# Patient Record
Sex: Female | Born: 1995 | Race: White | Hispanic: No | Marital: Single | Smoking: Current some day smoker
Health system: Southern US, Community
[De-identification: ages and names within clinical notes are randomized; demographics above are authoritative.]

## PROBLEM LIST (undated history)

## (undated) DIAGNOSIS — E109 Type 1 diabetes mellitus without complications: Secondary | ICD-10-CM

## (undated) HISTORY — PX: BACK SURGERY: SHX140

---
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
## 2015-01-01 ENCOUNTER — Encounter: Payer: Self-pay | Admitting: Internal Medicine

## 2015-01-01 DIAGNOSIS — F17210 Nicotine dependence, cigarettes, uncomplicated: Secondary | ICD-10-CM | POA: Diagnosis present

## 2015-01-01 DIAGNOSIS — Z833 Family history of diabetes mellitus: Secondary | ICD-10-CM | POA: Diagnosis not present

## 2015-01-01 DIAGNOSIS — E1110 Type 2 diabetes mellitus with ketoacidosis without coma: Secondary | ICD-10-CM | POA: Diagnosis present

## 2015-01-01 DIAGNOSIS — E1010 Type 1 diabetes mellitus with ketoacidosis without coma: Secondary | ICD-10-CM | POA: Diagnosis present

## 2015-01-01 DIAGNOSIS — Z794 Long term (current) use of insulin: Secondary | ICD-10-CM | POA: Diagnosis not present

## 2015-01-01 DIAGNOSIS — Z9889 Other specified postprocedural states: Secondary | ICD-10-CM | POA: Diagnosis not present

## 2015-01-01 LAB — COMPREHENSIVE METABOLIC PANEL
ALT: 14 U/L (ref 14–54)
AST: 15 U/L (ref 15–41)
Albumin: 4.3 g/dL (ref 3.5–5.0)
Alkaline Phosphatase: 74 U/L (ref 38–126)
Anion gap: 20 — ABNORMAL HIGH (ref 5–15)
BUN: 8 mg/dL (ref 6–20)
CHLORIDE: 103 mmol/L (ref 101–111)
CO2: 10 mmol/L — ABNORMAL LOW (ref 22–32)
CREATININE: 1.04 mg/dL — AB (ref 0.44–1.00)
Calcium: 9 mg/dL (ref 8.9–10.3)
GFR calc non Af Amer: >60 mL/min (ref >60)
Glucose, Bld: 372 mg/dL — ABNORMAL HIGH (ref 65–99)
POTASSIUM: 4.4 mmol/L (ref 3.5–5.1)
Sodium: 133 mmol/L — ABNORMAL LOW (ref 135–145)
Total Bilirubin: 1.2 mg/dL (ref 0.3–1.2)
Total Protein: 8.7 g/dL — ABNORMAL HIGH (ref 6.5–8.1)

## 2015-01-01 LAB — BASIC METABOLIC PANEL
Anion gap: 16 — ABNORMAL HIGH (ref 5–15)
Anion gap: 7 (ref 5–15)
Anion gap: 7 (ref 5–15)
BUN: 6 mg/dL (ref 6–20)
BUN: 6 mg/dL (ref 6–20)
BUN: 5 mg/dL — ABNORMAL LOW (ref 6–20)
CO2: 9 mmol/L — ABNORMAL LOW (ref 22–32)
CO2: 15 mmol/L — ABNORMAL LOW (ref 22–32)
CO2: 18 mmol/L — ABNORMAL LOW (ref 22–32)
Calcium: 8 mg/dL — ABNORMAL LOW (ref 8.9–10.3)
Calcium: 8.3 mg/dL — ABNORMAL LOW (ref 8.9–10.3)
Calcium: 8.4 mg/dL — ABNORMAL LOW (ref 8.9–10.3)
Chloride: 111 mmol/L (ref 101–111)
Chloride: 114 mmol/L — ABNORMAL HIGH (ref 101–111)
Chloride: 111 mmol/L (ref 101–111)
Creatinine, Ser: 0.92 mg/dL (ref 0.44–1.00)
Creatinine, Ser: 0.64 mg/dL (ref 0.44–1.00)
Creatinine, Ser: 0.82 mg/dL (ref 0.44–1.00)
GFR calc Af Amer: >60 mL/min (ref >60)
GFR calc Af Amer: >60 mL/min (ref >60)
GFR calc Af Amer: >60 mL/min (ref >60)
GFR calc non Af Amer: >60 mL/min (ref >60)
GFR calc non Af Amer: >60 mL/min (ref >60)
GFR calc non Af Amer: >60 mL/min (ref >60)
Glucose, Bld: 239 mg/dL — ABNORMAL HIGH (ref 65–99)
Glucose, Bld: 117 mg/dL — ABNORMAL HIGH (ref 65–99)
Glucose, Bld: 138 mg/dL — ABNORMAL HIGH (ref 65–99)
Potassium: 3.9 mmol/L (ref 3.5–5.1)
Potassium: 3.8 mmol/L (ref 3.5–5.1)
Potassium: 4.1 mmol/L (ref 3.5–5.1)
Sodium: 136 mmol/L (ref 135–145)
Sodium: 136 mmol/L (ref 135–145)
Sodium: 136 mmol/L (ref 135–145)

## 2015-01-01 LAB — GLUCOSE, CAPILLARY
Glucose-Capillary: 302 mg/dL — ABNORMAL HIGH (ref 65–99)
Glucose-Capillary: 322 mg/dL — ABNORMAL HIGH (ref 65–99)
Glucose-Capillary: 278 mg/dL — ABNORMAL HIGH (ref 65–99)
Glucose-Capillary: 209 mg/dL — ABNORMAL HIGH (ref 65–99)
Glucose-Capillary: 219 mg/dL — ABNORMAL HIGH (ref 65–99)
Glucose-Capillary: 210 mg/dL — ABNORMAL HIGH (ref 65–99)
Glucose-Capillary: 178 mg/dL — ABNORMAL HIGH (ref 65–99)
Glucose-Capillary: 159 mg/dL — ABNORMAL HIGH (ref 65–99)
Glucose-Capillary: 131 mg/dL — ABNORMAL HIGH (ref 65–99)
Glucose-Capillary: 107 mg/dL — ABNORMAL HIGH (ref 65–99)
Glucose-Capillary: 150 mg/dL — ABNORMAL HIGH (ref 65–99)
Glucose-Capillary: 129 mg/dL — ABNORMAL HIGH (ref 65–99)
Glucose-Capillary: 122 mg/dL — ABNORMAL HIGH (ref 65–99)
Glucose-Capillary: 221 mg/dL — ABNORMAL HIGH (ref 65–99)
Glucose-Capillary: 242 mg/dL — ABNORMAL HIGH (ref 65–99)
Glucose-Capillary: 180 mg/dL — ABNORMAL HIGH (ref 65–99)
Glucose-Capillary: 105 mg/dL — ABNORMAL HIGH (ref 65–99)
Glucose-Capillary: 265 mg/dL — ABNORMAL HIGH (ref 65–99)

## 2015-01-01 LAB — TSH: TSH: 0.96 u[IU]/mL (ref 0.350–4.500)

## 2015-01-01 LAB — URINALYSIS COMPLETE WITH MICROSCOPIC (ARMC ONLY)
BACTERIA UA: NONE SEEN
BILIRUBIN URINE: NEGATIVE
Glucose, UA: >500 mg/dL — AB
Hgb urine dipstick: NEGATIVE
Leukocytes, UA: NEGATIVE
NITRITE: NEGATIVE
PH: 5 (ref 5.0–8.0)
Protein, ur: 100 mg/dL — AB
SPECIFIC GRAVITY, URINE: 1.028 (ref 1.005–1.030)

## 2015-01-01 LAB — CBC
HCT: 45.1 % (ref 35.0–47.0)
HEMOGLOBIN: 14.6 g/dL (ref 12.0–16.0)
MCH: 28.8 pg (ref 26.0–34.0)
MCHC: 32.4 g/dL (ref 32.0–36.0)
MCV: 88.9 fL (ref 80.0–100.0)
PLATELETS: 272 10*3/uL (ref 150–440)
RBC: 5.07 MIL/uL (ref 3.80–5.20)
RDW: 12.7 % (ref 11.5–14.5)
WBC: 8 10*3/uL (ref 3.6–11.0)

## 2015-01-01 LAB — POCT PREGNANCY, URINE: Preg Test, Ur: NEGATIVE

## 2015-01-01 LAB — MRSA PCR SCREENING: MRSA by PCR: NEGATIVE

## 2015-01-01 MED ORDER — SODIUM CHLORIDE 0.9 % IV SOLN
INTRAVENOUS | Status: DC
  Administered 2015-01-01: 2.6 [IU]/h via INTRAVENOUS
  Administered 2015-01-01: 4.4 [IU]/h via INTRAVENOUS
  Filled 2015-01-01: qty 2.5

## 2015-01-01 MED ORDER — POTASSIUM CHLORIDE 10 MEQ/100ML IV SOLN
10.0000 meq | INTRAVENOUS | Status: DC
  Administered 2015-01-01: 10 meq via INTRAVENOUS
  Filled 2015-01-01 (×2): qty 100

## 2015-01-01 MED ORDER — PROMETHAZINE HCL 25 MG/ML IJ SOLN: 12.5000 mg | Freq: Four times a day (QID) | INTRAMUSCULAR | Status: DC | PRN

## 2015-01-01 MED ORDER — ENOXAPARIN SODIUM 40 MG/0.4ML ~~LOC~~ SOLN
40.0000 mg | SUBCUTANEOUS | Status: DC
  Filled 2015-01-01: qty 0.4

## 2015-01-01 MED ORDER — INSULIN ASPART 100 UNIT/ML ~~LOC~~ SOLN
0.0000 [IU] | Freq: Three times a day (TID) | SUBCUTANEOUS | Status: DC
  Administered 2015-01-01: 5 [IU] via SUBCUTANEOUS
  Administered 2015-01-02: 2 [IU] via SUBCUTANEOUS
  Filled 2015-01-01: qty 2
  Filled 2015-01-01: qty 5

## 2015-01-01 MED ORDER — KCL IN DEXTROSE-NACL 20-5-0.45 MEQ/L-%-% IV SOLN
INTRAVENOUS | Status: DC
  Administered 2015-01-01: 05:00:00 via INTRAVENOUS
  Filled 2015-01-01 (×3): qty 1000

## 2015-01-01 MED ORDER — INSULIN ASPART 100 UNIT/ML ~~LOC~~ SOLN
0.0000 [IU] | Freq: Every day | SUBCUTANEOUS | Status: DC
  Administered 2015-01-01: 3 [IU] via SUBCUTANEOUS
  Filled 2015-01-01: qty 3

## 2015-01-01 MED ORDER — SODIUM CHLORIDE 0.9 % IV SOLN
INTRAVENOUS | Status: DC
  Administered 2015-01-01: 03:00:00 via INTRAVENOUS

## 2015-01-01 MED ORDER — DEXTROSE-NACL 5-0.45 % IV SOLN: INTRAVENOUS | Status: DC

## 2015-01-01 MED ORDER — POTASSIUM CHLORIDE 2 MEQ/ML IV SOLN: INTRAVENOUS | Status: DC

## 2015-01-01 MED ORDER — INSULIN GLARGINE 100 UNIT/ML ~~LOC~~ SOLN
25.0000 [IU] | SUBCUTANEOUS | Status: DC
  Administered 2015-01-01: 25 [IU] via SUBCUTANEOUS
  Filled 2015-01-01 (×3): qty 0.25

## 2015-01-01 MED ORDER — ONDANSETRON HCL 4 MG/2ML IJ SOLN
4.0000 mg | Freq: Four times a day (QID) | INTRAMUSCULAR | Status: DC | PRN
  Administered 2015-01-01: 4 mg via INTRAVENOUS
  Filled 2015-01-01: qty 2

## 2015-01-01 MED ORDER — SODIUM CHLORIDE 0.9 % IV BOLUS (SEPSIS)
1000.0000 mL | Freq: Once | INTRAVENOUS | Status: AC
  Administered 2015-01-01: 1000 mL via INTRAVENOUS

## 2015-01-01 MED ORDER — INSULIN ASPART 100 UNIT/ML ~~LOC~~ SOLN
0.0000 [IU] | Freq: Three times a day (TID) | SUBCUTANEOUS | Status: DC
  Administered 2015-01-02: 3 [IU] via SUBCUTANEOUS
  Administered 2015-01-02: 9 [IU] via SUBCUTANEOUS
  Administered 2015-01-02: 5 [IU] via SUBCUTANEOUS
  Filled 2015-01-01: qty 5
  Filled 2015-01-01: qty 9
  Filled 2015-01-01: qty 3

## 2015-01-01 MED ORDER — SODIUM CHLORIDE 0.9 % IV SOLN: INTRAVENOUS | Status: DC

## 2015-01-01 MED ORDER — NORGESTIMATE-ETH ESTRADIOL 0.25-35 MG-MCG PO TABS
1.0000 | ORAL_TABLET | Freq: Every day | ORAL | Status: DC
Start: 2015-01-01 — End: 2015-01-02

## 2015-01-01 NOTE — Progress Notes (Signed)
Pt. BS 265 at 22:00, no HS converge on MAR. MD notified and HS sliding scale coverage added.

## 2015-01-01 NOTE — H&P (Signed)
Holly Martin is an 19 y.o. female.   Chief Complaint: Abdominal pain HPI: Patient presents emergency department complaining of abdominal pain and nausea. She states that she had been feeling normal until earlier today when she felt her overall energy level decreased and then began complaining of abdominal pain. She did have one episode of nonbloody nonbilious emesis. Notably over the last week the patient has had a decreased appetite but denies any pain or nausea at that time. In the emergency department she was found have an increased anion gap elevated blood sugar with ketones which prompted emergency department staff to call for admission for ketoacidosis  Past Medical History  Diagnosis Date  . Diabetes mellitus type 1     Past Surgical History  Procedure Laterality Date  . Back surgery      Family History  Problem Relation Age of Onset  . Diabetes Mellitus I Maternal Grandfather    Social History:  reports that she has been smoking.  She has never used smokeless tobacco. She reports that she does not drink alcohol. Her drug history is not on file.  Allergies: No Known Allergies  Medications Prior to Admission  Medication Sig Dispense Refill  . insulin glargine (LANTUS) 100 UNIT/ML injection Inject 30 Units into the skin at bedtime.    Marland Kitchen MONO-LINYAH 0.25-35 MG-MCG tablet Take 1 tablet by mouth daily.  0  . NOVOLOG FLEXPEN 100 UNIT/ML FlexPen Inject 50 Units into the skin 3 (three) times daily. Inject 6 units tid, as well as correction per each meal. Inject 1 ml per 10 grams of carbs per sliding scale up to 50 units .  0  . BAYER CONTOUR TEST test strip 1 strip by Other route 4 (four) times daily. To check blood sugars  0    Results for orders placed or performed during the hospital encounter of 12/31/14 (from the past 48 hour(s))  Glucose, capillary     Status: Abnormal   Collection Time: 12/31/14 11:13 PM  Result Value Ref Range   Glucose-Capillary 344 (H) 65 - 99 mg/dL   Blood gas, venous     Status: Abnormal   Collection Time: 12/31/14 11:17 PM  Result Value Ref Range   FIO2 0.21    pH, Ven 7.16 (LL) 7.320 - 7.430    Comment: CRITICAL RESULT CALLED TO, READ BACK BY AND VERIFIED WITH: DR. Jacqualine Code @ 0025 01/01/2015 JCG    pCO2, Ven 21 (L) 44.0 - 60.0 mmHg   Bicarbonate 7.5 (L) 21.0 - 28.0 mEq/L   Acid-base deficit 19.3 (H) 0.0 - 2.0 mmol/L   Patient temperature 37.0    Collection site VENOUS    Sample type VENOUS   Urinalysis complete, with microscopic ([facility] only)     Status: Abnormal   Collection Time: 12/31/14 11:17 PM  Result Value Ref Range   Color, Urine STRAW (A) YELLOW   APPearance CLEAR (A) CLEAR   Glucose, UA >500 (A) NEGATIVE mg/dL   Bilirubin Urine NEGATIVE NEGATIVE   Ketones, ur 2+ (A) NEGATIVE mg/dL   Specific Gravity, Urine 1.028 1.005 - 1.030   Hgb urine dipstick NEGATIVE NEGATIVE   pH 5.0 5.0 - 8.0   Protein, ur 100 (A) NEGATIVE mg/dL   Nitrite NEGATIVE NEGATIVE   Leukocytes, UA NEGATIVE NEGATIVE   RBC / HPF 0-5 0 - 5 RBC/hpf   WBC, UA 0-5 0 - 5 WBC/hpf   Bacteria, UA NONE SEEN NONE SEEN   Squamous Epithelial / LPF 0-5 (A) NONE SEEN  Mucous PRESENT   Pregnancy, urine POC     Status: None   Collection Time: 01/01/15 12:06 AM  Result Value Ref Range   Preg Test, Ur NEGATIVE NEGATIVE    Comment:        THE SENSITIVITY OF THIS METHODOLOGY IS >24 mIU/mL   CBC     Status: None   Collection Time: 01/01/15 12:08 AM  Result Value Ref Range   WBC 8.0 3.6 - 11.0 K/uL   RBC 5.07 3.80 - 5.20 MIL/uL   Hemoglobin 14.6 12.0 - 16.0 g/dL   HCT 45.1 35.0 - 47.0 %   MCV 88.9 80.0 - 100.0 fL   MCH 28.8 26.0 - 34.0 pg   MCHC 32.4 32.0 - 36.0 g/dL   RDW 12.7 11.5 - 14.5 %   Platelets 272 150 - 440 K/uL  Comprehensive metabolic panel     Status: Abnormal   Collection Time: 01/01/15 12:08 AM  Result Value Ref Range   Sodium 133 (L) 135 - 145 mmol/L   Potassium 4.4 3.5 - 5.1 mmol/L   Chloride 103 101 - 111 mmol/L   CO2 10 (L) 22 - 32  mmol/L   Glucose, Bld 372 (H) 65 - 99 mg/dL   BUN 8 6 - 20 mg/dL   Creatinine, Ser 1.04 (H) 0.44 - 1.00 mg/dL   Calcium 9.0 8.9 - 10.3 mg/dL   Total Protein 8.7 (H) 6.5 - 8.1 g/dL   Albumin 4.3 3.5 - 5.0 g/dL   AST 15 15 - 41 U/L   ALT 14 14 - 54 U/L   Alkaline Phosphatase 74 38 - 126 U/L   Total Bilirubin 1.2 0.3 - 1.2 mg/dL   GFR calc non Af Amer >60 >60 mL/min   GFR calc Af Amer >60 >60 mL/min    Comment: (NOTE) The eGFR has been calculated using the CKD EPI equation. This calculation has not been validated in all clinical situations. eGFR's persistently <60 mL/min signify possible Chronic Kidney Disease.    Anion gap 20 (H) 5 - 15  Glucose, capillary     Status: Abnormal   Collection Time: 01/01/15  1:19 AM  Result Value Ref Range   Glucose-Capillary 302 (H) 65 - 99 mg/dL  Glucose, capillary     Status: Abnormal   Collection Time: 01/01/15  2:23 AM  Result Value Ref Range   Glucose-Capillary 322 (H) 65 - 99 mg/dL  MRSA PCR Screening     Status: None   Collection Time: 01/01/15  2:34 AM  Result Value Ref Range   MRSA by PCR NEGATIVE NEGATIVE    Comment:        The GeneXpert MRSA Assay (FDA approved for NASAL specimens only), is one component of a comprehensive MRSA colonization surveillance program. It is not intended to diagnose MRSA infection nor to guide or monitor treatment for MRSA infections.   Glucose, capillary     Status: Abnormal   Collection Time: 01/01/15  3:32 AM  Result Value Ref Range   Glucose-Capillary 278 (H) 65 - 99 mg/dL   Comment 1 Notify RN   Basic metabolic panel (stat then every 4 hours)     Status: Abnormal   Collection Time: 01/01/15  4:26 AM  Result Value Ref Range   Sodium 136 135 - 145 mmol/L   Potassium 3.9 3.5 - 5.1 mmol/L   Chloride 111 101 - 111 mmol/L   CO2 9 (L) 22 - 32 mmol/L   Glucose, Bld 239 (H) 65 -  99 mg/dL   BUN 6 6 - 20 mg/dL   Creatinine, Ser 0.92 0.44 - 1.00 mg/dL   Calcium 8.0 (L) 8.9 - 10.3 mg/dL   GFR calc  non Af Amer >60 >60 mL/min   GFR calc Af Amer >60 >60 mL/min    Comment: (NOTE) The eGFR has been calculated using the CKD EPI equation. This calculation has not been validated in all clinical situations. eGFR's persistently <60 mL/min signify possible Chronic Kidney Disease.    Anion gap 16 (H) 5 - 15  TSH     Status: None   Collection Time: 01/01/15  4:26 AM  Result Value Ref Range   TSH 0.960 0.350 - 4.500 uIU/mL  Glucose, capillary     Status: Abnormal   Collection Time: 01/01/15  4:32 AM  Result Value Ref Range   Glucose-Capillary 219 (H) 65 - 99 mg/dL   Comment 1 Notify RN   Glucose, capillary     Status: Abnormal   Collection Time: 01/01/15  5:29 AM  Result Value Ref Range   Glucose-Capillary 209 (H) 65 - 99 mg/dL   Comment 1 Notify RN   Glucose, capillary     Status: Abnormal   Collection Time: 01/01/15  6:26 AM  Result Value Ref Range   Glucose-Capillary 210 (H) 65 - 99 mg/dL   Comment 1 Notify RN    No results found.  Review of Systems  Constitutional: Negative for fever and chills.  HENT: Negative for sore throat and tinnitus.   Eyes: Negative for blurred vision and redness.  Respiratory: Negative for cough and shortness of breath.   Cardiovascular: Negative for chest pain, palpitations, orthopnea and PND.  Gastrointestinal: Negative for nausea, vomiting, abdominal pain and diarrhea.  Genitourinary: Negative for dysuria, urgency and frequency.  Musculoskeletal: Negative for myalgias and joint pain.  Skin: Negative for rash.       No lesions  Neurological: Negative for speech change, focal weakness and weakness.  Endo/Heme/Allergies: Does not bruise/bleed easily.       No temperature intolerance  Psychiatric/Behavioral: Negative for depression and suicidal ideas.    Blood pressure 96/63, pulse 80, temperature 98.4 F (36.9 C), temperature source Oral, resp. rate 18, height _0  (1.651 m), weight 63.7 kg (140 lb 6.9 oz), last menstrual period 12/17/2014,  SpO2 98 %. Physical Exam  Vitals reviewed. Constitutional: She is oriented to person, place, and time. She appears well-developed and well-nourished. No distress.  HENT:  Head: Normocephalic and atraumatic.  Mouth/Throat: Oropharynx is clear and moist.  Eyes: Conjunctivae and EOM are normal. Pupils are equal, round, and reactive to light. No scleral icterus.  Neck: Normal range of motion. Neck supple. No JVD present. No tracheal deviation present. No thyromegaly present.  Cardiovascular: Normal rate, regular rhythm and normal heart sounds.  Exam reveals no gallop and no friction rub.   No murmur heard. Respiratory: Effort normal and breath sounds normal.  GI: Soft. Bowel sounds are normal. She exhibits no distension. There is no tenderness.  Genitourinary:  Deferred  Musculoskeletal: Normal range of motion. She exhibits no edema.  Lymphadenopathy:    She has no cervical adenopathy.  Neurological: She is alert and oriented to person, place, and time. No cranial nerve deficit. She exhibits normal muscle tone.  Skin: Skin is warm and dry. No rash noted. No erythema.  Psychiatric: She has a normal mood and affect. Her behavior is normal. Judgment and thought content normal.     Assessment/Plan This is a 19 year old  Caucasian female minute for diabetic ketoacidosis. 1. DKA: Type I; stable. We'll place the patient on an insulin drip and aggressively hydrate her until her anion gap closes. We will transition her to subcutaneous insulin while allowing her to eat and monitor her potassium levels. Replete as necessary. 2. Abdominal pain: Secondary to cramping usually associated with DKA. Manage nausea as needed. 3. DVT prophylaxis: Lovenox 4. GI prophylaxis: None The patient is full code. Time spent on admission orders and critical patient care approximately 35 minutes.  Harrie Foreman 01/01/2015, 6:42 AM

## 2015-01-01 NOTE — Progress Notes (Signed)
[facility] [place] NAME: Holly Martin    MR#:  [id number]  DATE OF BIRTH:  05/24/1996  SUBJECTIVE: Patient admitted for DKA. She has type 1 diabetes mellitus and never had DKA before. Suddenly started to feel nausea, vomiting, abdominal pain. Anion gap on admission was 20 and bicarbonate of 10. Now she feels better, she is on insulin drip at 2.8 units per hour and IV fluids normal saline at  100 cc/h   CHIEF COMPLAINT:   Chief Complaint  Patient presents with  . Hyperglycemia    REVIEW OF SYSTEMS:    Review of Systems  Constitutional: Negative for fever and chills.  HENT: Negative for hearing loss.   Eyes: Negative for blurred vision, double vision and photophobia.  Respiratory: Negative for cough, hemoptysis and shortness of breath.   Cardiovascular: Negative for palpitations, orthopnea and leg swelling.  Gastrointestinal: Negative for vomiting, abdominal pain and diarrhea.  Genitourinary: Negative for dysuria and urgency.  Musculoskeletal: Negative for myalgias and neck pain.  Skin: Negative for rash.  Neurological: Negative for dizziness, focal weakness, seizures, weakness and headaches.  Psychiatric/Behavioral: Negative for memory loss. The patient does not have insomnia.     Nutrition:  Tolerating Diet: Tolerating PT:      DRUG ALLERGIES:  No Known Allergies  VITALS:  Blood pressure 97/68, pulse 89, temperature 98.3 F (36.8 C), temperature source Oral, resp. rate 17, height 5\' 5"  (1.651 m), weight 63.7 kg (140 lb 6.9 oz), last menstrual period 12/17/2014, SpO2 99 %.  PHYSICAL EXAMINATION:   Physical Exam  GENERAL:  19 y.o.-year-old patient lying in the bed with no acute distress.  EYES: Pupils equal, round, reactive to light and accommodation. No scleral icterus. Extraocular muscles intact.  HEENT: Head atraumatic, normocephalic. Oropharynx and nasopharynx clear.  NECK:  Supple, no jugular venous  distention. No thyroid enlargement, no tenderness.  LUNGS: Normal breath sounds bilaterally, no wheezing, rales,rhonchi or crepitation. No use of accessory muscles of respiration.  CARDIOVASCULAR: S1, S2 normal. No murmurs, rubs, or gallops.  ABDOMEN: Soft, nontender, nondistended. Bowel sounds present. No organomegaly or mass.  EXTREMITIES: No pedal edema, cyanosis, or clubbing.  NEUROLOGIC: Cranial nerves II through XII are intact. Muscle strength 5/5 in all extremities. Sensation intact. Gait not checked.  PSYCHIATRIC: The patient is alert and oriented x 3.  SKIN: No obvious rash, lesion, or ulcer.    LABORATORY PANEL:   CBC  Recent Labs Lab 01/01/15 0008  WBC 8.0  HGB 14.6  HCT 45.1  PLT 272   ------------------------------------------------------------------------------------------------------------------  Chemistries   Recent Labs Lab 01/01/15 0008 01/01/15 0426  NA 133* 136  K 4.4 3.9  CL 103 111  CO2 10* 9*  GLUCOSE 372* 239*  BUN 8 6  CREATININE 1.04* 0.92  CALCIUM 9.0 8.0*  AST 15  --   ALT 14  --   ALKPHOS 74  --   BILITOT 1.2  --    ------------------------------------------------------------------------------------------------------------------  Cardiac Enzymes No results for input(s): TROPONINI in the last 168 hours. ------------------------------------------------------------------------------------------------------------------  RADIOLOGY:  No results found.   ASSESSMENT AND PLAN:   Active Problems:   Diabetic ketoacidosis   #1 DKA: Patient has a on IV fluids, insulin drip, anion gap is better but is still bicarbonate is low. Continue insulin drip, IV fluids, obtain endocrinology consult. And start her on clear liquid diet. #2 type 1 diabetes mellitus: Check hemoglobin A1c, she follows up with [facility] endocrinology, she takes Lantus 30  units at bedtime, and NovoLog 15 units 3 times a day. #3 monitor in ICU today continue fluids insulin drip,  endocrinology consult.   All the records are reviewed and case discussed with Care Management/Social Workerr. Management plans discussed with the patient, family and they are in agreement.  CODE STATUS: Full  TOTAL TIME TAKING CARE OF THIS PATIENT: 5min critical care time  POSSIBLE D/C IN 1 to2  DAYS, DEPENDING ON CLINICAL CONDITION.   Epifanio Lesches M.D on 01/01/2015 at 10:06 AM  Between 7am to 6pm - Pager - [phone]  After 6pm go to [email or web address] - password [email or web address] [facility]  [facility]  Office  [phone]  CC: Primary care physician; No primary care provider on file.

## 2015-01-01 NOTE — Progress Notes (Signed)
Spoke with Dr. Vianne Bulls about converting patient from insulin drip due to anion gap has closed and c02 serum has normalized.  Telephone order received to convert patient.

## 2015-01-01 NOTE — Progress Notes (Addendum)
Inpatient Diabetes Program Recommendations  AACE/ADA: New Consensus Statement on Inpatient Glycemic Control (2013)  Target Ranges:  Prepandial:   less than 140 mg/dL      Peak postprandial:   less than 180 mg/dL (1-2 hours)      Critically ill patients:  140 - 180 mg/dL   Results for Holly Martin, Holly Martin (MRN [id number]) as of 01/01/2015 08:29  Ref. Range 12/31/2014 23:13 01/01/2015 01:19 01/01/2015 02:23 01/01/2015 03:32 01/01/2015 04:32 01/01/2015 05:29 01/01/2015 06:26 01/01/2015 07:29  Glucose-Capillary Latest Ref Range: 65-99 mg/dL 344 (H) 302 (H) 322 (H) 278 (H) 219 (H) 209 (H) 210 (H) 178 (H)    Diabetes history: DM1 Outpatient Diabetes medications: Lantus 30 units QHS, Novolog 6 units TID with meals plus Novolog correction (1 unit for every 50 mg/dl above target glucose) Current orders for Inpatient glycemic control: Novolin R insulin drip per DKA protocol  Inpatient Diabetes Program Recommendations Insulin - IV drip/GlucoStabilizer: At this time patient still requiring insulin drip. According to labs on 01/01/15 at 4:26am CO2 9 and AG 16. Please continue insulin drip until acidosis is corrected as determined by MD (Venous CO2=20, normal anion gap (8-12), negative ketones). Once acidosis is cleared patient can be transitioned to SQ insulin. HgbA1C: Please order an A1C to evaluate glycemic control over the past 2-3 months.  Once acidosis is resolved as evidenced by CO2 >20 and AG <12 Recommend: Insulin - Basal:  Once acidosis is cleared and patient meets criteria to transition to SQ insulin, please consider ordering Lantus 25 units Q24H (based on 63.7 kg x 0.4 units). Correction (SSI):  Once acidosis is cleared and patient meets criteria to transition to SQ insulin, please consider ordering Novolog sensitive correction scale Q4H. Insulin - Meal Coverage:  Once acidosis is cleared and patient meets criteria to transition to SQ insulin, when diet is resumed please consider ordering Novolog 0-10 units  TID with meals (1 unit for every 10 grams of carbohydrates).  Note: In reviewing the chart, noted patient has Type 1 diabetes which was dx in 2008 and patient is followed by [facility]. In Care Everywhere tab, note that patient was last seen by Dr. Candy Sledge (Endocrinologist) on 12/01/2014 and A1C was 11.3% on 12/01/14. According to notes by Dr. Candy Sledge "Patient admits to having difficulty with carbohydrate counting, stating she estimates about 60g carbs for breakfast and lunch daily but admits she often underestimates dinner carb coverage for fear of going low. Patient would prefer having set mealtime insulin dose for each meal as this would be much easier for her than attempting to count carbs and doing the carb coverage math." Therefore patient was instructed to "Increase Lantus back to 30u qhs given persistent elev BGs. Give 6u Novolog for meals. Continue SSI 1: 50 > 150." Will continue to follow and make further recommendations as more data is collected.  01/01/15@12 :30-Spoke with patient and she reports that she is feeling much better. According to the patient, she did not eat anything yesterday because she was feeling so nauseated. She reports that she drank an energy drink around 11am yesterday and took Novolog insulin for the carbohydrates in the energy drink. Patient reports that she last took Novolog around 11am and last took her Lantus yesterday around 7:30 pm yesterday. Patient states that she become more nauseated throughout the day and starting vomiting.  According to the patient, her glucose has improved since she last saw her Endocrinologist on 12/01/14. She states that she has been taking Lantus 30 units QHS,  Novolog 6 units TID with meals for carbohydrates plus Novolog for correction (1 unit for every 50 mg/dl above target glucose of 150 mg/dl).  She reports that her glucose was trending between 170-215 mg/dl and then yesterday it was up over 300 mg/dl.  Patient reports that she has  never been in DKA before. Discussed DKA and hospital protocol for DKA treatment. Talked with patient about importance of continuing to take insulin even when she is sick and encouraged patient to check her glucose more often (at least every 4 hours) if she is sick so she can administer Novolog correction if needed. Discussed importance of proper nutrition. Patient reports that she has had a loss of appetite and nausea a few times over the past 2 weeks. Encouraged patient to make her doctor aware of issues with appetite and nausea. Patient verbalized understanding of information discussed and she states that she has no further questions related to diabetes at this time.  Thanks, Barnie Alderman, RN, MSN, CCRN, CDE Diabetes Coordinator Inpatient Diabetes Program [phone] (Team Pager from [place] to [place]) [phone] ([place] office) [phone] [facility] office) [phone] [facility] office)

## 2015-01-02 LAB — BASIC METABOLIC PANEL
ANION GAP: 8 (ref 5–15)
BUN: 7 mg/dL (ref 6–20)
CHLORIDE: 109 mmol/L (ref 101–111)
CO2: 20 mmol/L — AB (ref 22–32)
Calcium: 8.6 mg/dL — ABNORMAL LOW (ref 8.9–10.3)
Creatinine, Ser: 0.71 mg/dL (ref 0.44–1.00)
Glucose, Bld: 296 mg/dL — ABNORMAL HIGH (ref 65–99)
POTASSIUM: 3.4 mmol/L — AB (ref 3.5–5.1)
SODIUM: 137 mmol/L (ref 135–145)

## 2015-01-02 LAB — GLUCOSE, CAPILLARY
Glucose-Capillary: 245 mg/dL — ABNORMAL HIGH (ref 65–99)
Glucose-Capillary: 359 mg/dL — ABNORMAL HIGH (ref 65–99)
Glucose-Capillary: 229 mg/dL — ABNORMAL HIGH (ref 65–99)
Glucose-Capillary: 263 mg/dL — ABNORMAL HIGH (ref 65–99)

## 2015-01-02 LAB — BLOOD GAS, VENOUS
Acid-base deficit: 19.3 mmol/L — ABNORMAL HIGH (ref 0.0–2.0)
BICARBONATE: 7.5 meq/L — AB (ref 21.0–28.0)
FIO2: 0.21
PATIENT TEMPERATURE: 37
PH VEN: 7.16 — AB (ref 7.320–7.430)
pCO2, Ven: 21 mmHg — ABNORMAL LOW (ref 44.0–60.0)

## 2015-01-02 MED ORDER — INSULIN ASPART 100 UNIT/ML ~~LOC~~ SOLN: 6.0000 [IU] | Freq: Three times a day (TID) | SUBCUTANEOUS | Status: DC

## 2015-01-02 MED ORDER — INSULIN GLARGINE 100 UNIT/ML ~~LOC~~ SOLN
30.0000 [IU] | Freq: Every day | SUBCUTANEOUS | Status: DC
  Administered 2015-01-02: 30 [IU] via SUBCUTANEOUS
  Filled 2015-01-02: qty 0.3

## 2015-01-02 NOTE — Progress Notes (Signed)
Pt d/c'd to home today. IV removed & intact. Pt's discharge paperwork reviewed with all questions & concerns addressed. Education provided with d/c paperwork on BG monitoring & DKA. Pt's parents at bedside to transport home. Volunteer services utilized to transport pt downstairs via wheelchair.

## 2015-01-02 NOTE — Discharge Instructions (Signed)
Blood Glucose Monitoring °Monitoring your blood glucose (also know as blood sugar) helps you to manage your diabetes. It also helps you and your health care provider monitor your diabetes and determine how well your treatment plan is working. °WHY SHOULD YOU MONITOR YOUR BLOOD GLUCOSE? °· It can help you understand how food, exercise, and medicine affect your blood glucose. °· It allows you to know what your blood glucose is at any given moment. You can quickly tell if you are having low blood glucose (hypoglycemia) or high blood glucose (hyperglycemia). °· It can help you and your health care provider know how to adjust your medicines. °· It can help you understand how to manage an illness or adjust medicine for exercise. °WHEN SHOULD YOU TEST? °Your health care provider will help you decide how often you should check your blood glucose. This may depend on the type of diabetes you have, your diabetes control, or the types of medicines you are taking. Be sure to write down all of your blood glucose readings so that this information can be reviewed with your health care provider. See below for examples of testing times that your health care provider may suggest. °Type 1 Diabetes °· Test 4 times a day if you are in good control, using an insulin pump, or perform multiple daily injections. °· If your diabetes is not well controlled or if you are sick, you may need to monitor more often. °· It is a good idea to also monitor: °· Before and after exercise. °· Between meals and 2 hours after a meal. °· Occasionally between 2:00 a.m. and 3:00 a.m. °Type 2 Diabetes °· It can vary with each person, but generally, if you are on insulin, test 4 times a day. °· If you take medicines by mouth (orally), test 2 times a day. °· If you are on a controlled diet, test once a day. °· If your diabetes is not well controlled or if you are sick, you may need to monitor more often. °HOW TO MONITOR YOUR BLOOD GLUCOSE °Supplies  Needed °· Blood glucose meter. °· Test strips for your meter. Each meter has its own strips. You must use the strips that go with your own meter. °· A pricking needle (lancet). °· A device that holds the lancet (lancing device). °· A journal or log book to write down your results. °Procedure °· Wash your hands with soap and water. Alcohol is not preferred. °· Prick the side of your finger (not the tip) with the lancet. °· Gently milk the finger until a small drop of blood appears. °· Follow the instructions that come with your meter for inserting the test strip, applying blood to the strip, and using your blood glucose meter. °Other Areas to Get Blood for Testing °Some meters allow you to use other areas of your body (other than your finger) to test your blood. These areas are called alternative sites. The most common alternative sites are: °· The forearm. °· The thigh. °· The back area of the lower leg. °· The palm of the hand. °The blood flow in these areas is slower. Therefore, the blood glucose values you get may be delayed, and the numbers are different from what you would get from your fingers. Do not use alternative sites if you think you are having hypoglycemia. Your reading will not be accurate. Always use a finger if you are having hypoglycemia. Also, if you cannot feel your lows (hypoglycemia unawareness), always use your fingers for your   blood glucose checks. ADDITIONAL TIPS FOR GLUCOSE MONITORING  Do not reuse lancets.  Always carry your supplies with you.  All blood glucose meters have a 24-hour "hotline" number to call if you have questions or need help.  Adjust (calibrate) your blood glucose meter with a control solution after finishing a few boxes of strips. BLOOD GLUCOSE RECORD KEEPING It is a good idea to keep a daily record or log of your blood glucose readings. Most glucose meters, if not all, keep your glucose records stored in the meter. Some meters come with the ability to download  your records to your home computer. Keeping a record of your blood glucose readings is especially helpful if you are wanting to look for patterns. Make notes to go along with the blood glucose readings because you might forget what happened at that exact time. Keeping good records helps you and your health care provider to work together to achieve good diabetes management.  Document Released: 05/26/2003 Document Revised: 10/07/2013 Document Reviewed: 10/15/2012 [facility] Patient Information 2015 [place], [place]. This information is not intended to replace advice given to you by your health care provider. Make sure you discuss any questions you have with your health care provider.  Diabetic Ketoacidosis Diabetic ketoacidosis (DKA) is a life-threatening complication of type 1 diabetes. It must be quickly recognized and treated. Treatment requires hospitalization. CAUSES  When there is no insulin in the body, glucose (sugar) cannot be used, and the body breaks down fat for energy. When fat breaks down, acids (ketones) build up in the blood. Very high levels of glucose and high levels of acids lead to severe loss of body fluids (dehydration) and other dangerous chemical changes. This stresses your vital organs and can cause coma or death. SIGNS AND SYMPTOMS   Tiredness (fatigue).  Weight loss.  Excessive thirst.  Ketones in your urine.  Light-headedness.  Fruity or sweet smelling breath.  Excessive urination.  Visual changes.  Confusion or irritability.  Nausea or vomiting.  Rapid breathing.  Stomachache or abdominal pain. DIAGNOSIS  Your health care provider will diagnose DKA based on your history, physical exam, and blood tests. The health care provider will check to see if you have another illness that caused you to go into DKA. Most of this will be done quickly in an emergency room. TREATMENT   Fluid replacement to correct dehydration.  Insulin.  Correction of electrolytes,  such as potassium and sodium.  Antibiotic medicines. PREVENTION  Always take your insulin. Do not skip your insulin injections.  If you are sick, treat yourself quickly. Your body often needs more insulin to fight the illness.  Check your blood glucose regularly.  Check urine ketones if your blood glucose is greater than 240 milligrams per deciliter (mg/dL).  Do not use outdated (expired) insulin.  If your blood glucose is high, drink plenty of fluids. This helps flush out ketones. HOME CARE INSTRUCTIONS   If you are sick, follow the advice of your health care provider.  To prevent dehydration, drink enough water and fluids to keep your urine clear or pale yellow.  If you cannot eat, alternate between drinking fluids with sugar (soda, juices, flavored gelatin) and salty fluids (broth, bouillon).  If you can eat, follow your usual diet and drink sugar-free liquids (water, diet drinks).  Always take your usual dose of insulin. If you cannot eat or if your glucose is getting too low, call your health care provider for further instructions.  Continue to monitor your blood  or urine ketones every 3-4 hours around the clock. Set your alarm clock or have someone wake you up. If you are too sick, have someone test it for you.  Rest and avoid exercise. SEEK MEDICAL CARE IF:   You have a fever.  You have ketones in your urine, or your blood glucose is higher than a level your health care provider suggests. You may need extra insulin. Call your health care provider if you need advice on adjusting your insulin.  You cannot drink at least a tablespoon (15 mL) of fluid every 15-20 minutes.  You have been vomiting for more than 2 hours.  You have symptoms of DKA:  Fruity smelling breath.  Breathing faster or slower.  Becoming very sleepy. SEEK IMMEDIATE MEDICAL CARE IF:   You have signs of dehydration:  Decreased urination.  Increased thirst.  Dry skin and  mouth.  Light-headedness.  Your blood glucose is very high (as advised by your health care provider) twice in a row.  You faint.  You have chest pain or trouble breathing.  You have a sudden, severe headache.  You have sudden weakness in one arm or one leg.  You have sudden trouble speaking or swallowing.  You have vomiting or diarrhea that is getting worse after 3 hours.  You have abdominal pain. MAKE SURE YOU:   Understand these instructions.  Will watch your condition.  Will get help right away if you are not doing well or get worse. Document Released: 05/20/2000 Document Revised: 05/28/2013 Document Reviewed: 11/26/2008 [facility] Patient Information 2015 [place], [place]. This information is not intended to replace advice given to you by your health care provider. Make sure you discuss any questions you have with your health care provider.

## 2015-01-02 NOTE — Progress Notes (Signed)
[place] [facility] [place] NAME: Holly Martin    MR#:  [id number]  DATE OF BIRTH:  1996/04/10  SUBJECTIVE: Patient admitted for DKA. She has type 1 diabetes mellitus and never had DKA before. Suddenly started to feel nausea, vomiting, abdominal pain. Anion gap on admission was 20 and bicarbonate of 10.feels good now.no nausea or vomiting,  CHIEF COMPLAINT:   Chief Complaint  Patient presents with  . Hyperglycemia    REVIEW OF SYSTEMS:    Review of Systems  Constitutional: Negative for fever and chills.  HENT: Negative for hearing loss.   Eyes: Negative for blurred vision, double vision and photophobia.  Respiratory: Negative for cough, hemoptysis and shortness of breath.   Cardiovascular: Negative for palpitations, orthopnea and leg swelling.  Gastrointestinal: Negative for vomiting, abdominal pain and diarrhea.  Genitourinary: Negative for dysuria and urgency.  Musculoskeletal: Negative for myalgias and neck pain.  Skin: Negative for rash.  Neurological: Negative for dizziness, focal weakness, seizures, weakness and headaches.  Psychiatric/Behavioral: Negative for memory loss. The patient does not have insomnia.     Nutrition:  Tolerating Diet: Tolerating PT:      DRUG ALLERGIES:  No Known Allergies  VITALS:  Blood pressure 110/76, pulse 85, temperature 97.6 F (36.4 C), temperature source Oral, resp. rate 17, height 5\' 5"  (1.651 m), weight 67.903 kg (149 lb 11.2 oz), last menstrual period 12/17/2014, SpO2 100 %.  PHYSICAL EXAMINATION:   Physical Exam  GENERAL:  19 y.o.-year-old patient lying in the bed with no acute distress.  EYES: Pupils equal, round, reactive to light and accommodation. No scleral icterus. Extraocular muscles intact.  HEENT: Head atraumatic, normocephalic. Oropharynx and nasopharynx clear.  NECK:  Supple, no jugular venous distention. No thyroid enlargement, no tenderness.  LUNGS: Normal breath  sounds bilaterally, no wheezing, rales,rhonchi or crepitation. No use of accessory muscles of respiration.  CARDIOVASCULAR: S1, S2 normal. No murmurs, rubs, or gallops.  ABDOMEN: Soft, nontender, nondistended. Bowel sounds present. No organomegaly or mass.  EXTREMITIES: No pedal edema, cyanosis, or clubbing.  NEUROLOGIC: Cranial nerves II through XII are intact. Muscle strength 5/5 in all extremities. Sensation intact. Gait not checked.  PSYCHIATRIC: The patient is alert and oriented x 3.  SKIN: No obvious rash, lesion, or ulcer.    LABORATORY PANEL:   CBC  Recent Labs Lab 01/01/15 0008  WBC 8.0  HGB 14.6  HCT 45.1  PLT 272   ------------------------------------------------------------------------------------------------------------------  Chemistries   Recent Labs Lab 01/01/15 0008  01/02/15 0434  NA 133*  < > 137  K 4.4  < > 3.4*  CL 103  < > 109  CO2 10*  < > 20*  GLUCOSE 372*  < > 296*  BUN 8  < > 7  CREATININE 1.04*  < > 0.71  CALCIUM 9.0  < > 8.6*  AST 15  --   --   ALT 14  --   --   ALKPHOS 74  --   --   BILITOT 1.2  --   --   < > = values in this interval not displayed. ------------------------------------------------------------------------------------------------------------------  Cardiac Enzymes No results for input(s): TROPONINI in the last 168 hours. ------------------------------------------------------------------------------------------------------------------  RADIOLOGY:  No results found.   ASSESSMENT AND PLAN:   Active Problems:   Diabetic ketoacidosis   #1 DKA: Type 1 diabetes mellitus:  DKA resolved.;d/c iv fluids.  #2 type 1 diabetes mellitus: Check hemoglobin A1c, she follows up with [facility] endocrinology, she  takes Lantus 30 units at bedtime, and NovoLog 15 units 3 times a day. Consult endocrinology, check hemoglobin A1c, restart Lantus. Likely discharge today after follow-up with endocrinology.   All the records are reviewed and  case discussed with Care Management/Social Workerr. Management plans discussed with the patient, family and they are in agreement.  CODE STATUS: Full  TOTAL TIME TAKING CARE OF THIS PATIENT: 54min critical care time  POSSIBLE D/C IN 1 to2  DAYS, DEPENDING ON CLINICAL CONDITION.   Epifanio Lesches M.D on 01/02/2015 at 11:26 AM  Between 7am to 6pm - Pager - [phone]  After 6pm go to [email or web address] - password [email or web address] [facility]  [facility]  Office  [phone]  CC: Primary care physician; No primary care provider on file.

## 2015-01-02 NOTE — Consult Note (Signed)
ENDOCRINOLOGY CONSULTATION  REFERRING PHYSICIAN: Epifanio Lesches, MD  CONSULTING PHYSICIAN:  A. Lavone Orn, MD  CHIEF COMPLAINT:  Diabetic mellitus  HISTORY OF PRESENT ILLNESS:  19 y.o. female seen in consultation for diabetes type 1. She was hospitalized the evening on 7/27 for diabetic ketoacidosis. She was treated with IVF and IV insulin and then transitioned to SQ insulin. Now receiving Lantus 25 units daily and NovoLog qAC SSI only. She did not eat breakfast. Ate 100% of lunch meal. Blood sugars today have been in the range of 296 - 359.  She was diagnosed with diabetes at age 53. She follows at [facility]. Last appt was with Aurther Loft, MD on 12/01/14. Diabetes is chronically uncontrolled and her Hgb A1c has been in the 11% range for the last 12+ months. She has no known complications from diabetes. Home regimen is Lantus 30 units qhs and NovoLog 6 units tid AC + 1 unit per 50 mg/dl over a target of 150. She does not count carbohydrates. Rather aims to eat a consistent carb diet of 60 grams per meal. Claims to be checking sugars 4 times daily however out-patient notes indicate she checks 1-2 times daily and sometimes not checking for >24 hrs on weekends.    PAST MEDICAL HISTORY:  Past Medical History  Diagnosis Date  . Diabetes mellitus type 1      CURRENT MEDICATIONS:  . enoxaparin (LOVENOX) injection  40 mg Subcutaneous Q24H  . insulin aspart  0-10 Units Subcutaneous TID WC  . insulin aspart  0-5 Units Subcutaneous QHS  . insulin aspart  0-9 Units Subcutaneous TID WC  . insulin glargine  25 Units Subcutaneous Q24H  . norgestimate-ethinyl estradiol  1 tablet Oral Daily     SOCIAL HISTORY:  Lives with family. Employed as a Educational psychologist. History  Substance Use Topics  . Smoking status: Current Some Day Smoker  . Smokeless tobacco: Never Used  . Alcohol Use: No     FAMILY HISTORY:   Family History  Problem Relation Age of Onset  . Diabetes  Mellitus I Maternal Grandfather      ALLERGIES:  No Known Allergies  REVIEW OF SYSTEMS:  GENERAL:  No weight loss.  No fever.  HEENT:  No blurred vision. No sore throat.  NECK:  No neck pain or dysphagia.  CARDIAC:  No chest pain or palpitation.  PULMONARY:  No cough or shortness of breath.  ABDOMEN:  No abdominal pain.  No constipation. EXTREMITIES:  No lower extremity swelling.  ENDOCRINE:  No heat or cold intolerance.  GENITOURINARY:  No dysuria or hematuria. SKIN:  No recent rash or skin changes.   PHYSICAL EXAMINATION:  BP 110/76 mmHg  Pulse 85  Temp(Src) 97.6 F (36.4 C) (Oral)  Resp 17  Ht 5\' 5"  (1.651 m)  Wt 67.903 kg (149 lb 11.2 oz)  BMI 24.91 kg/m2  SpO2 100%  LMP 12/17/2014 (Exact Date)  GENERAL:  Well-developed female in NAD. HEENT:  EOMI.  Oropharynx is clear.  NECK:  Supple.  No thyromegaly.  No neck tenderness.  CARDIAC:  Regular rate and rhythm without murmur.  PULMONARY:  Clear to auscultation bilaterally.  ABDOMEN:  Diffusely soft, nontender, nondistended.  EXTREMITIES:  No peripheral edema is present.    SKIN:  No rash or dermatopathy. NEUROLOGIC:  No dysarthria.  No tremor. PSYCHIATRIC:  Alert and oriented, calm, cooperative.   LABORATORY DATA: Results for orders placed or performed during the hospital encounter of 12/31/14 (from the past 24  hour(s))  Glucose, capillary     Status: Abnormal   Collection Time: 01/01/15  4:21 PM  Result Value Ref Range   Glucose-Capillary 180 (H) 65 - 99 mg/dL  Glucose, capillary     Status: Abnormal   Collection Time: 01/01/15  5:28 PM  Result Value Ref Range   Glucose-Capillary 105 (H) 65 - 99 mg/dL  Glucose, capillary     Status: Abnormal   Collection Time: 01/01/15  9:18 PM  Result Value Ref Range   Glucose-Capillary 265 (H) 65 - 99 mg/dL  Glucose, capillary     Status: Abnormal   Collection Time: 01/02/15 12:26 AM  Result Value Ref Range   Glucose-Capillary 245 (H) 65 - 99 mg/dL  Basic metabolic  panel     Status: Abnormal   Collection Time: 01/02/15  4:34 AM  Result Value Ref Range   Sodium 137 135 - 145 mmol/L   Potassium 3.4 (L) 3.5 - 5.1 mmol/L   Chloride 109 101 - 111 mmol/L   CO2 20 (L) 22 - 32 mmol/L   Glucose, Bld 296 (H) 65 - 99 mg/dL   BUN 7 6 - 20 mg/dL   Creatinine, Ser 0.71 0.44 - 1.00 mg/dL   Calcium 8.6 (L) 8.9 - 10.3 mg/dL   GFR calc non Af Amer >60 >60 mL/min   GFR calc Af Amer >60 >60 mL/min   Anion gap 8 5 - 15  Glucose, capillary     Status: Abnormal   Collection Time: 01/02/15  8:30 AM  Result Value Ref Range   Glucose-Capillary 359 (H) 65 - 99 mg/dL   Comment 1 AFTER ORANGE JUICE   Glucose, capillary     Status: Abnormal   Collection Time: 01/02/15 11:47 AM  Result Value Ref Range   Glucose-Capillary 229 (H) 65 - 99 mg/dL   Comment 1 Notify RN     ASSESSMENT:  1. Diabetes mellitus, type 1  PLAN: 1. Increase Lantus to 30 units - give q 24s. Will give tonight at 5 PM and anticipate she will move this to bedtime dosing after discharge. 2. Restart NovoLog 6 units tid AC.  3. She would benefit from carb counting with precise dosing of NovoLog per carb intake however this should be a long-term plan that she works on with her clinical diabetes educator and endocrinologist.  Thank you for the kind consultation. Tanaisha should follow up with her Endocrinologist at [facility] within next month.

## 2015-01-02 NOTE — Progress Notes (Signed)
Pt's BS was 359. 9 units of novolog (sliding scale) given as ordered.  Pt consumed 100% orange juice & 1/2 carton of milk for breakfast. A total of 20g of carbs. Novolog 2 units given as ordered.  Pt received a total of 11 units for coverage.

## 2015-01-02 NOTE — Progress Notes (Signed)
Inpatient Diabetes Program Recommendations  AACE/ADA: New Consensus Statement on Inpatient Glycemic Control (2013)  Target Ranges:  Prepandial:   less than 140 mg/dL      Peak postprandial:   less than 180 mg/dL (1-2 hours)      Critically ill patients:  140 - 180 mg/dL   Results for Holly Martin, Holly Martin (MRN [id number]) as of 01/02/2015 08:29  Ref. Range 01/01/2015 13:32 01/01/2015 14:35 01/01/2015 15:23 01/01/2015 16:21 01/01/2015 17:28 01/01/2015 21:18 01/02/2015 00:26  Glucose-Capillary Latest Ref Range: 65-99 mg/dL 122 (H) 242 (H) 221 (H) 180 (H) 105 (H) 265 (H) 245 (H)    Diabetes history: DM1 Outpatient Diabetes medications: Lantus 30 units QHS, Novolog 6 units TID with meals plus Novolog correction (1 unit for every 50 mg/dl above target glucose) Current orders for Inpatient glycemic control: Lantus 25 units Q24H, Novolog 0-9 units TID with meals, Novolog 0-5 units HS, Novolog 0-10 units TID with meals for meal coverage  Inpatient Diabetes Program Recommendations Insulin - Basal: Please consider increasing Lantus to 30 units daily. Insulin - Meal Coverage: NURSING: Please use Novolog 0-10 units TID for meal coverage in which patient is to receive 1 unit for every 10 grams of carbohydrates. HgbA1C: Please order an A1C to evaluate glycemic control over the past 2-3 months. Inpatient Referral: Note that Dr. Gabriel Carina has been consulted and should be seeing patient today and making insulin adjustments as needed. If Dr. Gabriel Carina has not seen by before noon, attending MD may want to go ahead and increase Lantus as recommended above.  Thanks, Barnie Alderman, RN, MSN, CCRN, CDE Diabetes Coordinator Inpatient Diabetes Program [phone] (Team Pager from [place] to [place]) [phone] ([place] office) [phone] [facility] office) [phone] [facility] office)

## 2015-01-08 NOTE — Discharge Summary (Signed)
Holly Martin, is a 19 y.o. female  DOB 1995/08/23  MRN [id number].  Admission date:  12/31/2014  Admitting Physician  Harrie Foreman, MD  Discharge Date:  01/02/2015   Primary MD  No primary care provider on file.  Recommendations for primary care physician for things to follow: Follow up with her pediatric  endocrinologist  Admission Diagnosis  Diabetic ketoacidosis without coma associated with type 1 diabetes mellitus [E10.10]   Discharge Diagnosis  Diabetic ketoacidosis without coma associated with type 1 diabetes mellitus [E10.10]    Active Problems:   Diabetic ketoacidosis      Past Medical History  Diagnosis Date  . Diabetes mellitus type 1     Past Surgical History  Procedure Laterality Date  . Back surgery      Corrective scoliosis surgery       History of present illness and  Hospital Course:     Kindly see H&P for history of present illness and admission details, please review complete Labs, Consult reports and Test reports for all details in brief  HPI  from the history and  physical done on the day of admission  y old female with  Type i DM admitted for nausea ,vomiting and   Found to have  DKA wirth Anion gap    20.     Ph 7.16.admitted to ICU,started on Insulin drip.                                                                                                                                         Hospital Course  1.DKA;type i DMII;started on IV fluids,INsulin drip,monitored in ICU.Her Anion Gap is closed.moved out of ICU.infection work is Forensic psychologist by DR.Solum ,mand according to her note her HBa1c is in 11%range,she suggested to increase lantus to 30 units q24 hrs,novolog 6 units TID AC,carb counting with precise  Dose if novolog for carb intake.Advised her to follow up with her Pediatric endocrinologist next month.   Discharge Condition: stable   Follow UP  Follow-up Information    Please follow up.   Contact information:   her endocrinologist in a month        Discharge Instructions  and  Discharge Medications       Medication List    TAKE these medications        BAYER CONTOUR TEST test strip  Generic drug:  glucose blood  1 strip by Other route 4 (four) times daily. To check blood sugars     insulin glargine 100 UNIT/ML injection  Commonly known as:  LANTUS  Inject 30 Units into the skin at bedtime.     MONO-LINYAH 0.25-35 MG-MCG tablet  Generic drug:  norgestimate-ethinyl estradiol  Take 1 tablet by mouth daily.     NOVOLOG FLEXPEN 100 UNIT/ML FlexPen  Generic drug:  insulin aspart  Inject 50 Units into the skin 3 (three) times daily. Inject 6 units tid, as well as correction per each meal. Inject 1 ml per 10 grams of carbs per sliding scale up to 50 units .          Diet and Activity recommendation: See Discharge Instructions above   Consults obtained - endocrinology   Major procedures and Radiology Reports - PLEASE review detailed  and final reports for all details, in brief -     No results found.  Micro  Results     Recent Results (from the past 240 hour(s))  MRSA PCR Screening     Status: None   Collection Time: 01/01/15  2:34 AM  Result Value Ref Range Status   MRSA by PCR NEGATIVE NEGATIVE Final    Comment:        The GeneXpert MRSA Assay (FDA approved for NASAL specimens only), is one component of a comprehensive MRSA colonization surveillance program. It is not intended to diagnose MRSA infection nor to guide or monitor treatment for MRSA infections.        Today   Subjective:   Holly Martin today has no headache,no chest abdominal pain,no new weakness tingling or numbness, feels much better wants to go home today.  Objective:   Blood pressure 110/76, pulse 85, temperature 97.6 F (36.4 C), temperature source Oral, resp. rate 17, height 5\' 5"  (1.651 m), weight 67.903 kg (149 lb 11.2 oz), last menstrual period 12/17/2014, SpO2 100 %.  No intake or output data in the 24 hours ending 01/08/15 0021  Exam Awake Alert, Oriented x 3, No new F.N deficits, Normal affect [place].AT,PERRAL Supple Neck,No JVD, No cervical lymphadenopathy appriciated.  Symmetrical Chest wall movement, Good air movement bilaterally, CTAB RRR,No Gallops,Rubs or new Murmurs, No Parasternal Heave +ve B.Sounds, Abd Soft, Non tender, No organomegaly appriciated, No rebound -guarding or rigidity. No Cyanosis, Clubbing or edema, No new Rash or bruise  Data Review   CBC w Diff:  Lab Results  Component Value Date   WBC 8.0 01/01/2015   HGB 14.6 01/01/2015   HCT 45.1 01/01/2015   PLT 272 01/01/2015    CMP:  Lab Results  Component Value Date   NA 137 01/02/2015   K 3.4* 01/02/2015   CL 109 01/02/2015   CO2 20* 01/02/2015   BUN 7 01/02/2015   CREATININE 0.71 01/02/2015   PROT 8.7* 01/01/2015   ALBUMIN 4.3 01/01/2015   BILITOT 1.2 01/01/2015   ALKPHOS 74 01/01/2015   AST 15 01/01/2015   ALT 14 01/01/2015  .   Total Time in preparing paper work, data evaluation and todays exam - 28  minutes  Azzure Garabedian M.D on 01/02/2015 at 12:21 AM

## 2019-02-13 ENCOUNTER — Other Ambulatory Visit: Payer: Self-pay

## 2019-02-13 ENCOUNTER — Encounter: Payer: Self-pay | Admitting: Emergency Medicine

## 2019-02-13 ENCOUNTER — Inpatient Hospital Stay
Admission: EM | Admit: 2019-02-13 | Discharge: 2019-02-17 | DRG: 871 | Disposition: A | Discharge status: Home / Self Care | Payer: BC Managed Care – PPO | Attending: Internal Medicine | Admitting: Internal Medicine

## 2019-02-13 DIAGNOSIS — J690 Pneumonitis due to inhalation of food and vomit: Secondary | ICD-10-CM | POA: Diagnosis present

## 2019-02-13 DIAGNOSIS — E876 Hypokalemia: Secondary | ICD-10-CM | POA: Diagnosis present

## 2019-02-13 DIAGNOSIS — N390 Urinary tract infection, site not specified: Secondary | ICD-10-CM | POA: Diagnosis present

## 2019-02-13 DIAGNOSIS — E1110 Type 2 diabetes mellitus with ketoacidosis without coma: Secondary | ICD-10-CM | POA: Diagnosis present

## 2019-02-13 DIAGNOSIS — M419 Scoliosis, unspecified: Secondary | ICD-10-CM | POA: Diagnosis present

## 2019-02-13 DIAGNOSIS — J9601 Acute respiratory failure with hypoxia: Secondary | ICD-10-CM | POA: Diagnosis present

## 2019-02-13 DIAGNOSIS — E1010 Type 1 diabetes mellitus with ketoacidosis without coma: Secondary | ICD-10-CM | POA: Diagnosis present

## 2019-02-13 DIAGNOSIS — R0902 Hypoxemia: Secondary | ICD-10-CM

## 2019-02-13 DIAGNOSIS — A419 Sepsis, unspecified organism: Secondary | ICD-10-CM | POA: Diagnosis present

## 2019-02-13 DIAGNOSIS — Z833 Family history of diabetes mellitus: Secondary | ICD-10-CM | POA: Diagnosis not present

## 2019-02-13 DIAGNOSIS — Z79899 Other long term (current) drug therapy: Secondary | ICD-10-CM

## 2019-02-13 DIAGNOSIS — Z794 Long term (current) use of insulin: Secondary | ICD-10-CM

## 2019-02-13 DIAGNOSIS — R630 Anorexia: Secondary | ICD-10-CM | POA: Diagnosis not present

## 2019-02-13 DIAGNOSIS — R112 Nausea with vomiting, unspecified: Secondary | ICD-10-CM | POA: Diagnosis present

## 2019-02-13 DIAGNOSIS — Z20828 Contact with and (suspected) exposure to other viral communicable diseases: Secondary | ICD-10-CM | POA: Diagnosis present

## 2019-02-13 DIAGNOSIS — N179 Acute kidney failure, unspecified: Secondary | ICD-10-CM | POA: Diagnosis present

## 2019-02-13 DIAGNOSIS — R1110 Vomiting, unspecified: Secondary | ICD-10-CM

## 2019-02-13 DIAGNOSIS — F17200 Nicotine dependence, unspecified, uncomplicated: Secondary | ICD-10-CM | POA: Diagnosis present

## 2019-02-13 DIAGNOSIS — E871 Hypo-osmolality and hyponatremia: Secondary | ICD-10-CM | POA: Diagnosis present

## 2019-02-13 DIAGNOSIS — E860 Dehydration: Secondary | ICD-10-CM | POA: Diagnosis present

## 2019-02-13 LAB — CBC
HCT: 40.8 % (ref 36.0–46.0)
Hemoglobin: 14 g/dL (ref 12.0–15.0)
MCH: 30.4 pg (ref 26.0–34.0)
MCHC: 34.3 g/dL (ref 30.0–36.0)
MCV: 88.5 fL (ref 80.0–100.0)
Platelets: 176 10*3/uL (ref 150–400)
RBC: 4.61 MIL/uL (ref 3.87–5.11)
RDW: 12.1 % (ref 11.5–15.5)
WBC: 12.4 10*3/uL — ABNORMAL HIGH (ref 4.0–10.5)
nRBC: 0 % (ref 0.0–0.2)

## 2019-02-13 LAB — BASIC METABOLIC PANEL
Anion gap: 13 (ref 5–15)
BUN: 37 mg/dL — ABNORMAL HIGH (ref 6–20)
CO2: 20 mmol/L — ABNORMAL LOW (ref 22–32)
Calcium: 8.4 mg/dL — ABNORMAL LOW (ref 8.9–10.3)
Chloride: 96 mmol/L — ABNORMAL LOW (ref 98–111)
Creatinine, Ser: 2.13 mg/dL — ABNORMAL HIGH (ref 0.44–1.00)
GFR calc Af Amer: 37 mL/min — ABNORMAL LOW (ref >60)
GFR calc non Af Amer: 32 mL/min — ABNORMAL LOW (ref >60)
Glucose, Bld: 269 mg/dL — ABNORMAL HIGH (ref 70–99)
Potassium: 3.7 mmol/L (ref 3.5–5.1)
Sodium: 129 mmol/L — ABNORMAL LOW (ref 135–145)

## 2019-02-13 LAB — COMPREHENSIVE METABOLIC PANEL
ALT: 17 U/L (ref 0–44)
AST: 16 U/L (ref 15–41)
Albumin: 3.8 g/dL (ref 3.5–5.0)
Alkaline Phosphatase: 134 U/L — ABNORMAL HIGH (ref 38–126)
Anion gap: 17 — ABNORMAL HIGH (ref 5–15)
BUN: 38 mg/dL — ABNORMAL HIGH (ref 6–20)
CO2: 21 mmol/L — ABNORMAL LOW (ref 22–32)
Calcium: 9.2 mg/dL (ref 8.9–10.3)
Chloride: 90 mmol/L — ABNORMAL LOW (ref 98–111)
Creatinine, Ser: 2.63 mg/dL — ABNORMAL HIGH (ref 0.44–1.00)
GFR calc Af Amer: 29 mL/min — ABNORMAL LOW (ref >60)
GFR calc non Af Amer: 25 mL/min — ABNORMAL LOW (ref >60)
Glucose, Bld: 381 mg/dL — ABNORMAL HIGH (ref 70–99)
Potassium: 4 mmol/L (ref 3.5–5.1)
Sodium: 128 mmol/L — ABNORMAL LOW (ref 135–145)
Total Bilirubin: 0.8 mg/dL (ref 0.3–1.2)
Total Protein: 8.5 g/dL — ABNORMAL HIGH (ref 6.5–8.1)

## 2019-02-13 LAB — GLUCOSE, CAPILLARY
Glucose-Capillary: 375 mg/dL — ABNORMAL HIGH (ref 70–99)
Glucose-Capillary: 335 mg/dL — ABNORMAL HIGH (ref 70–99)
Glucose-Capillary: 313 mg/dL — ABNORMAL HIGH (ref 70–99)
Glucose-Capillary: 291 mg/dL — ABNORMAL HIGH (ref 70–99)
Glucose-Capillary: 232 mg/dL — ABNORMAL HIGH (ref 70–99)
Glucose-Capillary: 196 mg/dL — ABNORMAL HIGH (ref 70–99)
Glucose-Capillary: 185 mg/dL — ABNORMAL HIGH (ref 70–99)
Glucose-Capillary: 146 mg/dL — ABNORMAL HIGH (ref 70–99)

## 2019-02-13 LAB — LIPASE, BLOOD: Lipase: 22 U/L (ref 11–51)

## 2019-02-13 MED ORDER — INSULIN REGULAR(HUMAN) IN NACL 100-0.9 UT/100ML-% IV SOLN
INTRAVENOUS | Status: DC
  Administered 2019-02-13: 17:00:00 2.5 [IU]/h via INTRAVENOUS
  Filled 2019-02-13: qty 100

## 2019-02-13 MED ORDER — INSULIN ASPART 100 UNIT/ML ~~LOC~~ SOLN
0.0000 [IU] | Freq: Three times a day (TID) | SUBCUTANEOUS | Status: DC
  Administered 2019-02-14: 18:00:00 5 [IU] via SUBCUTANEOUS
  Filled 2019-02-13: qty 1

## 2019-02-13 MED ORDER — INSULIN ASPART 100 UNIT/ML ~~LOC~~ SOLN: 0.0000 [IU] | Freq: Every day | SUBCUTANEOUS | Status: DC

## 2019-02-13 MED ORDER — ACETAMINOPHEN 650 MG RE SUPP: 650.0000 mg | Freq: Four times a day (QID) | RECTAL | Status: DC | PRN

## 2019-02-13 MED ORDER — SODIUM CHLORIDE 0.9% FLUSH
3.0000 mL | Freq: Once | INTRAVENOUS | Status: AC
  Administered 2019-02-13: 3 mL via INTRAVENOUS

## 2019-02-13 MED ORDER — POLYETHYLENE GLYCOL 3350 17 G PO PACK: 17.0000 g | PACK | Freq: Every day | ORAL | Status: DC | PRN

## 2019-02-13 MED ORDER — ENOXAPARIN SODIUM 40 MG/0.4ML ~~LOC~~ SOLN
40.0000 mg | SUBCUTANEOUS | Status: DC
  Administered 2019-02-14 – 2019-02-16 (×3): 40 mg via SUBCUTANEOUS
  Filled 2019-02-13 (×4): qty 0.4

## 2019-02-13 MED ORDER — ONDANSETRON HCL 4 MG PO TABS: 4.0000 mg | ORAL_TABLET | Freq: Four times a day (QID) | ORAL | Status: DC | PRN

## 2019-02-13 MED ORDER — DEXTROSE-NACL 5-0.45 % IV SOLN
INTRAVENOUS | Status: DC
  Administered 2019-02-13: 20:00:00 via INTRAVENOUS

## 2019-02-13 MED ORDER — DEXTROSE-NACL 5-0.45 % IV SOLN: INTRAVENOUS | Status: DC

## 2019-02-13 MED ORDER — ACETAMINOPHEN 325 MG PO TABS
650.0000 mg | ORAL_TABLET | Freq: Four times a day (QID) | ORAL | Status: DC | PRN
  Administered 2019-02-13 – 2019-02-17 (×6): 650 mg via ORAL
  Filled 2019-02-13 (×7): qty 2

## 2019-02-13 MED ORDER — SODIUM CHLORIDE 0.9 % IV SOLN
INTRAVENOUS | Status: DC
  Administered 2019-02-14 – 2019-02-17 (×9): via INTRAVENOUS

## 2019-02-13 MED ORDER — ONDANSETRON HCL 4 MG/2ML IJ SOLN
INTRAMUSCULAR | Status: AC
  Administered 2019-02-13: 17:00:00 4 mg via INTRAVENOUS
  Filled 2019-02-13: qty 2

## 2019-02-13 MED ORDER — POTASSIUM CHLORIDE 10 MEQ/100ML IV SOLN
10.0000 meq | INTRAVENOUS | Status: AC
  Administered 2019-02-13: 10 meq via INTRAVENOUS
  Filled 2019-02-13 (×2): qty 100

## 2019-02-13 MED ORDER — POTASSIUM CHLORIDE 10 MEQ/100ML IV SOLN
10.0000 meq | INTRAVENOUS | Status: AC
  Administered 2019-02-13: 18:00:00 10 meq via INTRAVENOUS
  Filled 2019-02-13 (×2): qty 100

## 2019-02-13 MED ORDER — ONDANSETRON HCL 4 MG/2ML IJ SOLN
4.0000 mg | Freq: Once | INTRAMUSCULAR | Status: AC
  Administered 2019-02-13: 17:00:00 4 mg via INTRAVENOUS

## 2019-02-13 MED ORDER — ENOXAPARIN SODIUM 40 MG/0.4ML ~~LOC~~ SOLN: 30.0000 mg | SUBCUTANEOUS | Status: DC

## 2019-02-13 MED ORDER — SODIUM CHLORIDE 0.9 % IV BOLUS
1000.0000 mL | Freq: Once | INTRAVENOUS | Status: AC
  Administered 2019-02-13: 1000 mL via INTRAVENOUS

## 2019-02-13 MED ORDER — ONDANSETRON HCL 4 MG/2ML IJ SOLN
4.0000 mg | Freq: Four times a day (QID) | INTRAMUSCULAR | Status: DC | PRN
  Administered 2019-02-14 – 2019-02-17 (×5): 4 mg via INTRAVENOUS
  Filled 2019-02-13 (×5): qty 2

## 2019-02-13 MED ORDER — INSULIN GLARGINE 100 UNIT/ML ~~LOC~~ SOLN
30.0000 [IU] | Freq: Every day | SUBCUTANEOUS | Status: DC
  Administered 2019-02-13: 20:00:00 30 [IU] via SUBCUTANEOUS
  Filled 2019-02-13 (×2): qty 0.3

## 2019-02-13 MED ORDER — SODIUM CHLORIDE 0.9 % IV BOLUS
1000.0000 mL | Freq: Once | INTRAVENOUS | Status: AC
  Administered 2019-02-13: 17:00:00 1000 mL via INTRAVENOUS

## 2019-02-13 NOTE — ED Notes (Addendum)
Per admitting MD, give lantus, wait 1 hour then d/c insulin drip

## 2019-02-13 NOTE — ED Triage Notes (Signed)
Says sick since the weekend with fatigue, vomiting and her sugar is staying 2-300s.

## 2019-02-13 NOTE — ED Notes (Signed)
Pt states that starting Saturday she didn't have an appetite, and reports fatigue. States that Monday she began to vomit anytime that she would drink anything. Pt has a history of type 1 DM, and states that her doctor told her to come to the ED to make sure that she wasn't dehydrated because her blood glucose remained high after taking her insulin.

## 2019-02-13 NOTE — Progress Notes (Signed)
DKA has resolved.  Anion gap closed and bicarb at 20.  Ordered Lantus.  Stop insulin drip.  Start a diabetic diet. Will change admission to medical floor.

## 2019-02-13 NOTE — ED Notes (Signed)
Will notify MD of CBG and see what he wants this RN to do. Pt is planning to go home per [employer] in report.

## 2019-02-13 NOTE — Progress Notes (Signed)
Inpatient Diabetes Program Recommendations  AACE/ADA: New Consensus Statement on Inpatient Glycemic Control (2015)  Target Ranges:  Prepandial:   less than 140 mg/dL      Peak postprandial:   less than 180 mg/dL (1-2 hours)      Critically ill patients:  140 - 180 mg/dL   Lab Results  Component Value Date   GLUCAP 335 (H) 02/13/2019    Review of Glycemic Control  Diabetes history: DM1 (Makes NO insulin so will require basal, meal coverage and correction) Outpatient Diabetes medications: Basaglar 26 units + Novolog up to total of 30 units daily Current orders for Inpatient glycemic control: IV insulin drip  Inpatient Diabetes Program Recommendations:   -A1c to determine glycemic control  Patient has seen endocrinologist Dr.Kirkman @ [facility]. Will plan to speak with patient tomorrow after admission.  Thank you, Nani Gasser. Kyung Muto, RN, MSN, CDE  Diabetes Coordinator Inpatient Glycemic Control Team Team Pager [phone] (8am-5pm) 02/13/2019 4:07 PM

## 2019-02-13 NOTE — ED Notes (Signed)
ED TO INPATIENT HANDOFF REPORT  ED Nurse Name and Phone #: gracie  S Name/Age/Gender Holly Martin 23 y.o. female Room/Bed: ED08A/ED08A  Code Status   Code Status: Full Code  Home/SNF/Other Home Patient oriented to: self, place, time and situation Is this baseline? Yes   Triage Complete: Triage complete  Chief Complaint diabetic vomiting high sugars  Triage Note Says sick since the weekend with fatigue, vomiting and her sugar is staying 2-300s.     Allergies No Known Allergies  Level of Care/Admitting Diagnosis ED Disposition    ED Disposition Condition [facility] Area: [place] [[id number]]  Level of Care: Med-Surg [16]  Covid Evaluation: N/A  Diagnosis: AKI (acute kidney injury) [facility]) [id number]  Admitting Physician: Hillary Bow [id number]  Attending Physician: Hillary Bow [id number]  Estimated length of stay: past midnight tomorrow  Certification:: I certify this patient will need inpatient services for at least 2 midnights  PT Class (Do Not Modify): Inpatient [101]  PT Acc Code (Do Not Modify): Private [1]       B Medical/Surgery History Past Medical History:  Diagnosis Date  . Diabetes mellitus type 1 ([place])    Past Surgical History:  Procedure Laterality Date  . BACK SURGERY     Corrective scoliosis surgery     A IV Location/Drains/Wounds Patient Lines/Drains/Airways Status   Active Line/Drains/Airways    Name:   Placement date:   Placement time:   Site:   Days:   Peripheral IV 02/13/19 Right Antecubital   02/13/19    1331    Antecubital   less than 1   Peripheral IV 02/13/19 Left Antecubital   02/13/19    1428    Antecubital   less than 1          Intake/Output Last 24 hours  Intake/Output Summary (Last 24 hours) at 02/13/2019 2121 Last data filed at 02/13/2019 1930 Gross per 24 hour  Intake 1098.07 ml  Output -  Net 1098.07 ml    Labs/Imaging Results for orders placed or performed during the  hospital encounter of 02/13/19 (from the past 48 hour(s))  Glucose, capillary     Status: Abnormal   Collection Time: 02/13/19  1:26 PM  Result Value Ref Range   Glucose-Capillary 375 (H) 70 - 99 mg/dL  Lipase, blood     Status: None   Collection Time: 02/13/19  1:32 PM  Result Value Ref Range   Lipase 22 11 - 51 U/L    Comment: Performed at [facility], [address] [place], [place] [address]  Comprehensive metabolic panel     Status: Abnormal   Collection Time: 02/13/19  1:32 PM  Result Value Ref Range   Sodium 128 (L) 135 - 145 mmol/L   Potassium 4.0 3.5 - 5.1 mmol/L   Chloride 90 (L) 98 - 111 mmol/L   CO2 21 (L) 22 - 32 mmol/L   Glucose, Bld 381 (H) 70 - 99 mg/dL   BUN 38 (H) 6 - 20 mg/dL   Creatinine, Ser 2.63 (H) 0.44 - 1.00 mg/dL   Calcium 9.2 8.9 - 10.3 mg/dL   Total Protein 8.5 (H) 6.5 - 8.1 g/dL   Albumin 3.8 3.5 - 5.0 g/dL   AST 16 15 - 41 U/L   ALT 17 0 - 44 U/L   Alkaline Phosphatase 134 (H) 38 - 126 U/L   Total Bilirubin 0.8 0.3 - 1.2 mg/dL   GFR calc non Af Amer 25 (  L) >60 mL/min   GFR calc Af Amer 29 (L) >60 mL/min   Anion gap 17 (H) 5 - 15    Comment: Performed at [facility], [address] [place], [place] [address]  CBC     Status: Abnormal   Collection Time: 02/13/19  1:32 PM  Result Value Ref Range   WBC 12.4 (H) 4.0 - 10.5 K/uL   RBC 4.61 3.87 - 5.11 MIL/uL   Hemoglobin 14.0 12.0 - 15.0 g/dL   HCT 40.8 36.0 - 46.0 %   MCV 88.5 80.0 - 100.0 fL   MCH 30.4 26.0 - 34.0 pg   MCHC 34.3 30.0 - 36.0 g/dL   RDW 12.1 11.5 - 15.5 %   Platelets 176 150 - 400 K/uL   nRBC 0.0 0.0 - 0.2 %    Comment: Performed at [facility], [address] [place], [place] [address]  Glucose, capillary     Status: Abnormal   Collection Time: 02/13/19  3:58 PM  Result Value Ref Range   Glucose-Capillary 335 (H) 70 - 99 mg/dL   Comment 1 Notify RN    Comment 2 Document in Chart   Glucose, capillary     Status: Abnormal   Collection Time:  02/13/19  4:52 PM  Result Value Ref Range   Glucose-Capillary 313 (H) 70 - 99 mg/dL  Glucose, capillary     Status: Abnormal   Collection Time: 02/13/19  6:01 PM  Result Value Ref Range   Glucose-Capillary 291 (H) 70 - 99 mg/dL  Basic metabolic panel     Status: Abnormal   Collection Time: 02/13/19  6:40 PM  Result Value Ref Range   Sodium 129 (L) 135 - 145 mmol/L   Potassium 3.7 3.5 - 5.1 mmol/L   Chloride 96 (L) 98 - 111 mmol/L   CO2 20 (L) 22 - 32 mmol/L   Glucose, Bld 269 (H) 70 - 99 mg/dL   BUN 37 (H) 6 - 20 mg/dL   Creatinine, Ser 2.13 (H) 0.44 - 1.00 mg/dL   Calcium 8.4 (L) 8.9 - 10.3 mg/dL   GFR calc non Af Amer 32 (L) >60 mL/min   GFR calc Af Amer 37 (L) >60 mL/min   Anion gap 13 5 - 15    Comment: Performed at [facility], [address] [place], [place] [address]  Glucose, capillary     Status: Abnormal   Collection Time: 02/13/19  7:13 PM  Result Value Ref Range   Glucose-Capillary 232 (H) 70 - 99 mg/dL  Glucose, capillary     Status: Abnormal   Collection Time: 02/13/19  8:22 PM  Result Value Ref Range   Glucose-Capillary 196 (H) 70 - 99 mg/dL  Glucose, capillary     Status: Abnormal   Collection Time: 02/13/19  8:50 PM  Result Value Ref Range   Glucose-Capillary 185 (H) 70 - 99 mg/dL   No results found.  Pending Labs Unresulted Labs (From admission, onward)    Start     Ordered   02/20/19 0500  Creatinine, serum  (enoxaparin (LOVENOX)    CrCl < 30 ml/min)  Weekly,   STAT    Comments: while on enoxaparin therapy.    02/13/19 1724   02/14/19 0500  CBC  Tomorrow morning,   STAT     02/13/19 1724   02/14/19 [id number]  Basic metabolic panel  Tomorrow morning,   STAT     02/13/19 1724   02/13/19 1900  HIV  Antibody (routine testing w rflx)  Once,   R     02/13/19 1900   02/13/19 1650  Hemoglobin A1c  Add-on,   AD    Comments: To assess prior glycemic control.    02/13/19 1650   02/13/19 1419  SARS CORONAVIRUS 2 (TAT 6-24 HRS) Nasopharyngeal  Nasopharyngeal Swab  (Asymptomatic/Tier 2 Patients Labs)  Once,   STAT    Question Answer Comment  Is this test for diagnosis or screening Screening   Symptomatic for COVID-19 as defined by CDC No   Hospitalized for COVID-19 No   Admitted to ICU for COVID-19 No   Previously tested for COVID-19 No   Resident in a congregate (group) care setting No   Employed in healthcare setting No   Pregnant No      02/13/19 1418   02/13/19 1306  Urinalysis, Complete w Microscopic  ONCE - STAT,   STAT     02/13/19 1306          Vitals/Pain Today's Vitals   02/13/19 1915 02/13/19 1930 02/13/19 2030 02/13/19 2100  BP:  122/89 131/90 (!) 130/95  Pulse:  (!) 118 (!) 123 (!) 114  Resp:  19 (!) 23 (!) 21  Temp:      TempSrc:      SpO2:  96% 100% 100%  Weight:      Height:      PainSc: 0-No pain       Isolation Precautions No active isolations  Medications Medications  insulin regular, human (MYXREDLIN) 100 units/ 100 mL infusion (2.5 Units/hr Intravenous New Bag/Given 02/13/19 1653)  potassium chloride 10 mEq in 100 mL IVPB (0 mEq Intravenous Stopped 02/13/19 1953)  0.9 %  sodium chloride infusion (has no administration in time range)  dextrose 5 %-0.45 % sodium chloride infusion (has no administration in time range)  potassium chloride 10 mEq in 100 mL IVPB (0 mEq Intravenous Stopped 02/13/19 1930)  acetaminophen (TYLENOL) tablet 650 mg (650 mg Oral Given 02/13/19 2056)    Or  acetaminophen (TYLENOL) suppository 650 mg ( Rectal See Alternative 02/13/19 2056)  polyethylene glycol (MIRALAX / GLYCOLAX) packet 17 g (has no administration in time range)  ondansetron (ZOFRAN) tablet 4 mg (has no administration in time range)    Or  ondansetron (ZOFRAN) injection 4 mg (has no administration in time range)  enoxaparin (LOVENOX) injection 40 mg (has no administration in time range)  insulin glargine (LANTUS) injection 30 Units (30 Units Subcutaneous Given 02/13/19 2025)  insulin aspart (novoLOG) injection  0-15 Units (has no administration in time range)  insulin aspart (novoLOG) injection 0-5 Units (has no administration in time range)  sodium chloride flush (NS) 0.9 % injection 3 mL (3 mLs Intravenous Given 02/13/19 1332)  sodium chloride 0.9 % bolus 1,000 mL (0 mLs Intravenous Stopped 02/13/19 1456)  sodium chloride 0.9 % bolus 1,000 mL (1,000 mLs Intravenous New Bag/Given 02/13/19 1633)  sodium chloride 0.9 % bolus 1,000 mL (1,000 mLs Intravenous New Bag/Given 02/13/19 1719)  ondansetron (ZOFRAN) injection 4 mg (4 mg Intravenous Given 02/13/19 1716)    Mobility walks Low fall risk   Focused Assessments high cbg   R Recommendations: See Admitting Provider Note  Report given to:   Additional Notes:

## 2019-02-13 NOTE — H&P (Signed)
[place] [facility] [place] NAME: Holly Martin    MR#:  [id number]  DATE OF BIRTH:  07-04-1995  DATE OF ADMISSION:  02/13/2019  PRIMARY CARE PHYSICIAN: Patient, No Pcp Per   REQUESTING/REFERRING PHYSICIAN: Dr. Joni Fears  CHIEF COMPLAINT:   Chief Complaint  Patient presents with  . Emesis  . Hyperglycemia  . Weakness    HISTORY OF PRESENT ILLNESS:  Holly Martin  is a 23 y.o. female with a known history of diabetes type 1 on insulin at home presents to the hospital complaining of 3 days of nausea, vomiting and high blood sugars.  She takes 30 units of glargine and pre-meal insulin.  She has tried increased doses of pre-meal insulin with no improvement.  Normally her blood sugars run around 160.  Now blood sugars are 250-3 50.  PAST MEDICAL HISTORY:   Past Medical History:  Diagnosis Date  . Diabetes mellitus type 1 ([place])     PAST SURGICAL HISTORY:   Past Surgical History:  Procedure Laterality Date  . BACK SURGERY     Corrective scoliosis surgery    SOCIAL HISTORY:   Social History   Tobacco Use  . Smoking status: Current Some Day Smoker  . Smokeless tobacco: Never Used  Substance Use Topics  . Alcohol use: No    Alcohol/week: 0.0 standard drinks    FAMILY HISTORY:   Family History  Problem Relation Age of Onset  . Diabetes Mellitus I Maternal Grandfather     DRUG ALLERGIES:  No Known Allergies  REVIEW OF SYSTEMS:   Review of Systems  Constitutional: Positive for malaise/fatigue. Negative for chills and fever.  HENT: Negative for sore throat.   Eyes: Negative for blurred vision, double vision and pain.  Respiratory: Negative for cough, hemoptysis, shortness of breath and wheezing.   Cardiovascular: Negative for chest pain, palpitations, orthopnea and leg swelling.  Gastrointestinal: Positive for nausea and vomiting. Negative for abdominal pain, constipation, diarrhea and heartburn.  Genitourinary: Negative for  dysuria and hematuria.  Musculoskeletal: Negative for back pain and joint pain.  Skin: Negative for rash.  Neurological: Negative for sensory change, speech change, focal weakness and headaches.  Endo/Heme/Allergies: Does not bruise/bleed easily.  Psychiatric/Behavioral: Negative for depression. The patient is not nervous/anxious.     MEDICATIONS AT HOME:   Prior to Admission medications   Medication Sig Start Date End Date Taking? Authorizing Provider  BAYER CONTOUR TEST test strip 1 strip by Other route 4 (four) times daily. To check blood sugars 12/01/14  Yes [provider]  Insulin Glargine (BASAGLAR KWIKPEN) 100 UNIT/ML SOPN Inject 30 Units into the skin every morning. 05/10/18  Yes [provider]  NOVOLOG FLEXPEN 100 UNIT/ML FlexPen Inject 0-12 Units into the skin 3 (three) times daily. Sliding scale 12/01/14  Yes [provider]  spironolactone (ALDACTONE) 100 MG tablet Take 100 mg by mouth 2 (two) times daily.    Yes [provider]     VITAL SIGNS:  Blood pressure 112/88, pulse (!) 128, temperature 98.4 F (36.9 C), temperature source Oral, resp. rate 17, height 5\' 5"  (1.651 m), weight 79.4 kg, last menstrual period 02/05/2019, SpO2 100 %.  PHYSICAL EXAMINATION:  Physical Exam  GENERAL:  23 y.o.-year-old patient lying in the bed with no acute distress.  EYES: Pupils equal, round, reactive to light and accommodation. No scleral icterus. Extraocular muscles intact.  HEENT: Head atraumatic, normocephalic. Oropharynx and nasopharynx clear. No oropharyngeal erythema, dry oral mucosa  NECK:  Supple, no jugular venous distention. No thyroid enlargement, no tenderness.  LUNGS: Normal breath sounds bilaterally, no wheezing, rales, rhonchi. No use of accessory muscles of respiration.  CARDIOVASCULAR: S1, S2, tachycardia ABDOMEN: Soft, nontender, nondistended. Bowel sounds present. No organomegaly or mass.  EXTREMITIES: No pedal edema, cyanosis, or  clubbing. + 2 pedal & radial pulses b/l.   NEUROLOGIC: Cranial nerves II through XII are intact. No focal Motor or sensory deficits appreciated b/l PSYCHIATRIC: The patient is alert and oriented x 3. Good affect.  SKIN: No obvious rash, lesion, or ulcer.   LABORATORY PANEL:   CBC Recent Labs  Lab 02/13/19 1332  WBC 12.4*  HGB 14.0  HCT 40.8  PLT 176   ------------------------------------------------------------------------------------------------------------------  Chemistries  Recent Labs  Lab 02/13/19 1332  NA 128*  K 4.0  CL 90*  CO2 21*  GLUCOSE 381*  BUN 38*  CREATININE 2.63*  CALCIUM 9.2  AST 16  ALT 17  ALKPHOS 134*  BILITOT 0.8   ------------------------------------------------------------------------------------------------------------------  Cardiac Enzymes No results for input(s): TROPONINI in the last 168 hours. ------------------------------------------------------------------------------------------------------------------  RADIOLOGY:  No results found.   IMPRESSION AND PLAN:   *DKA.  Start insulin drip, IV fluid bolus.  BMP every 4 hours.  Accu-Cheks every 1 hour.  Transition to basal home dose insulin depending on her progress.  Will follow repeat BMP.  *Acute kidney injury secondary to dehydration, DKA.  Start IV fluids.  Monitor input and output.  *Hyponatremia secondary to dehydration.  Should improve with IV fluids.  *DVT prophylaxis with Lovenox  All the records are reviewed and case discussed with ED provider. Management plans discussed with the patient, family and they are in agreement.  CODE STATUS: FULL CODE  TOTAL CRITICAL CARE TIME TAKING CARE OF THIS PATIENT: 40 minutes.   Leia Alf Ridwan Bondy M.D on 02/13/2019 at 4:50 PM  Between 7am to 6pm - Pager - [phone]  After 6pm go to [email or web address] - password [email or web address] [facility]  Office  [phone]  CC: Primary care physician; Patient, No Pcp Per  Note:  This dictation was prepared with Dragon dictation along with smaller phrase technology. Any transcriptional errors that result from this process are unintentional.

## 2019-02-13 NOTE — ED Notes (Signed)
Attempted report, secretary reported that pt will be going to room 16 now and the room is not clean at this time.

## 2019-02-13 NOTE — Progress Notes (Signed)
Anticoagulation monitoring(Lovenox):  23yo  F ordered Lovenox 30 mg Q24h  Filed Weights   02/13/19 1305  Weight: 175 lb (79.4 kg)   BMI 29   Lab Results  Component Value Date   CREATININE 2.63 (H) 02/13/2019   CREATININE 0.71 01/02/2015   CREATININE 0.82 01/01/2015   Estimated Creatinine Clearance: 34.7 mL/min (A) (by C-G formula based on SCr of 2.63 mg/dL (H)). Hemoglobin & Hematocrit     Component Value Date/Time   HGB 14.0 02/13/2019 1332   HCT 40.8 02/13/2019 1332     Per Protocol for Patient with estCrcl > 30 ml/min and BMI < 40, will transition to Lovenox 40 mg Q24h.      Chinita Greenland PharmD Clinical Pharmacist 02/13/2019

## 2019-02-13 NOTE — ED Provider Notes (Signed)
[facility] Emergency Department Provider Note  ____________________________________________  Time seen: Approximately 3:20 PM  I have reviewed the triage vital signs and the nursing notes.   HISTORY  Chief Complaint Emesis, Hyperglycemia, and Weakness    HPI Holly Martin is a 23 y.o. female with a history of type 1 diabetes on basal insulin and NovoLog who comes the ED complaining of fatigue  for the past 4 days.  Constant, no aggravating or alleviating factors.  Denies any associated pain fevers chills or body aches.  She does have intermittent vomiting and inability to take anything by mouth.  She has been taking double of her insulin but still unable to keep her blood sugar under control which normally runs about 150 but has lately been greater than 200 persistently.  No known sick contacts or high risk exposures to cope with.  She endorses polyuria  Past Medical History:  Diagnosis Date  . Diabetes mellitus type 1 [facility])      Patient Active Problem List   Diagnosis Date Noted  . Diabetic ketoacidosis ([place]) 01/01/2015     Past Surgical History:  Procedure Laterality Date  . BACK SURGERY     Corrective scoliosis surgery     Prior to Admission medications   Medication Sig Start Date End Date Taking? Authorizing Provider  BAYER CONTOUR TEST test strip 1 strip by Other route 4 (four) times daily. To check blood sugars 12/01/14   [provider]  insulin glargine (LANTUS) 100 UNIT/ML injection Inject 30 Units into the skin at bedtime.    [provider]  [facility] 0.25-35 MG-MCG tablet Take 1 tablet by mouth daily. 10/26/14   [provider]  NOVOLOG FLEXPEN 100 UNIT/ML FlexPen Inject 50 Units into the skin 3 (three) times daily. Inject 6 units tid, as well as correction per each meal. Inject 1 ml per 10 grams of carbs per sliding scale up to 50 units . 12/01/14   [provider]     Allergies Patient has  no known allergies.   Family History  Problem Relation Age of Onset  . Diabetes Mellitus I Maternal Grandfather     Social History Social History   Tobacco Use  . Smoking status: Current Some Day Smoker  . Smokeless tobacco: Never Used  Substance Use Topics  . Alcohol use: No    Alcohol/week: 0.0 standard drinks  . Drug use: No    Review of Systems  Constitutional:   No fever or chills.  ENT:   No sore throat. No rhinorrhea. Cardiovascular:   No chest pain or syncope. Respiratory:   No dyspnea or cough. Gastrointestinal:   Negative for abdominal pain, vomiting and diarrhea.  Positive urinary frequency Musculoskeletal:   Negative for focal pain or swelling All other systems reviewed and are negative except as documented above in ROS and HPI.  ____________________________________________   PHYSICAL EXAM:  VITAL SIGNS: ED Triage Vitals  Enc Vitals Group     BP 02/13/19 1320 112/88     Pulse Rate 02/13/19 1320 (!) 128     Resp 02/13/19 1320 17     Temp 02/13/19 1320 98.4 F (36.9 C)     Temp Source 02/13/19 1320 Oral     SpO2 02/13/19 1320 100 %     Weight 02/13/19 1305 175 lb (79.4 kg)     Height 02/13/19 1305 5\' 5"  (1.651 m)     Head Circumference --      Peak Flow --  Pain Score 02/13/19 1305 0     Pain Loc --      Pain Edu? --      Excl. in [place]? --     Vital signs reviewed, nursing assessments reviewed.   Constitutional:   Alert and oriented. Non-toxic appearance. Eyes:   Conjunctivae are normal. EOMI. PERRL. ENT      Head:   Normocephalic and atraumatic.        Neck:   No meningismus. Full ROM. Hematological/Lymphatic/Immunilogical:   No cervical lymphadenopathy. Cardiovascular:   Tachycardia heart rate 120. Symmetric bilateral radial and DP pulses.  No murmurs. Cap refill less than 2 seconds. Respiratory:   Normal respiratory effort without tachypnea/retractions. Breath sounds are clear and equal bilaterally. No  wheezes/rales/rhonchi. Gastrointestinal:   Soft and nontender. Non distended. There is no CVA tenderness.  No rebound, rigidity, or guarding. Musculoskeletal:   Normal range of motion in all extremities. No joint effusions.  No lower extremity tenderness.  No edema. Neurologic:   Normal speech and language.  Motor grossly intact. No acute focal neurologic deficits are appreciated.  Skin:    Skin is warm, dry and intact. No rash noted.  No petechiae, purpura, or bullae.  ____________________________________________    LABS (pertinent positives/negatives) (all labs ordered are listed, but only abnormal results are displayed) Labs Reviewed  COMPREHENSIVE METABOLIC PANEL - Abnormal; Notable for the following components:      Result Value   Sodium 128 (*)    Chloride 90 (*)    CO2 21 (*)    Glucose, Bld 381 (*)    BUN 38 (*)    Creatinine, Ser 2.63 (*)    Total Protein 8.5 (*)    Alkaline Phosphatase 134 (*)    GFR calc non Af Amer 25 (*)    GFR calc Af Amer 29 (*)    Anion gap 17 (*)    All other components within normal limits  CBC - Abnormal; Notable for the following components:   WBC 12.4 (*)    All other components within normal limits  GLUCOSE, CAPILLARY - Abnormal; Notable for the following components:   Glucose-Capillary 375 (*)    All other components within normal limits  SARS CORONAVIRUS 2 (TAT 6-24 HRS)  LIPASE, BLOOD  URINALYSIS, COMPLETE (UACMP) WITH MICROSCOPIC  POC URINE PREG, ED  POC URINE PREG, ED   ____________________________________________   EKG    ____________________________________________    RADIOLOGY  No results found.  ____________________________________________   PROCEDURES .Critical Care Performed by: Carrie Mew, MD Authorized by: Carrie Mew, MD   Critical care provider statement:    Critical care time (minutes):  35   Critical care time was exclusive of:  Separately billable procedures and treating other  patients   Critical care was necessary to treat or prevent imminent or life-threatening deterioration of the following conditions:  Metabolic crisis, shock, dehydration and endocrine crisis   Critical care was time spent personally by me on the following activities:  Development of treatment plan with patient or surrogate, discussions with consultants, evaluation of patient's response to treatment, examination of patient, obtaining history from patient or surrogate, ordering and performing treatments and interventions, ordering and review of laboratory studies, ordering and review of radiographic studies, pulse oximetry, re-evaluation of patient's condition and review of old charts    ____________________________________________    CLINICAL IMPRESSION / ASSESSMENT AND PLAN / ED COURSE  Medications ordered in the ED: Medications  sodium chloride 0.9 % bolus 1,000 mL (has  no administration in time range)  insulin regular, human (MYXREDLIN) 100 units/ 100 mL infusion (has no administration in time range)  dextrose 5 %-0.45 % sodium chloride infusion (has no administration in time range)  potassium chloride 10 mEq in 100 mL IVPB (has no administration in time range)  sodium chloride flush (NS) 0.9 % injection 3 mL (3 mLs Intravenous Given 02/13/19 1332)  sodium chloride 0.9 % bolus 1,000 mL (0 mLs Intravenous Stopped 02/13/19 1456)    Pertinent labs & imaging results that were available during my care of the patient were reviewed by me and considered in my medical decision making (see chart for details).  Holly Martin was evaluated in Emergency Department on 02/13/2019 for the symptoms described in the history of present illness. She was evaluated in the context of the global COVID-19 pandemic, which necessitated consideration that the patient might be at risk for infection with the SARS-CoV-2 virus that causes COVID-19. Institutional protocols and algorithms that pertain to the evaluation of  patients at risk for COVID-19 are in a state of rapid change based on information released by regulatory bodies including the CDC and federal and state organizations. These policies and algorithms were followed during the patient's care in the ED.   Patient with type 1 diabetes presents with tachycardia and fatigue and hyperglycemia.  Labs show anion gap metabolic acidosis consistent with DKA, hyponatremia, AK I with a creatinine elevated to 2.6 compared to a baseline of 0.7.  Her tachycardia appears to be due to high-grade hypovolemic shock.  We will give 2 L of IV saline bolus for resuscitation, start an insulin drip.  Follow-up urinalysis.  COVID screening, admit to hospital.      ____________________________________________   FINAL CLINICAL IMPRESSION(S) / ED DIAGNOSES    Final diagnoses:  Dehydration  Diabetic ketoacidosis without coma associated with type 1 diabetes mellitus ([place])  AKI (acute kidney injury) [facility])     ED Discharge Orders    None      Portions of this note were generated with dragon dictation software. Dictation errors may occur despite best attempts at proofreading.   Carrie Mew, MD 02/13/19 1524

## 2019-02-14 ENCOUNTER — Inpatient Hospital Stay: Payer: BC Managed Care – PPO

## 2019-02-14 LAB — BASIC METABOLIC PANEL
Anion gap: 12 (ref 5–15)
BUN: 28 mg/dL — ABNORMAL HIGH (ref 6–20)
CO2: 16 mmol/L — ABNORMAL LOW (ref 22–32)
Calcium: 7.9 mg/dL — ABNORMAL LOW (ref 8.9–10.3)
Chloride: 104 mmol/L (ref 98–111)
Creatinine, Ser: 1.55 mg/dL — ABNORMAL HIGH (ref 0.44–1.00)
GFR calc Af Amer: 54 mL/min — ABNORMAL LOW (ref >60)
GFR calc non Af Amer: 47 mL/min — ABNORMAL LOW (ref >60)
Glucose, Bld: 99 mg/dL (ref 70–99)
Potassium: 3.3 mmol/L — ABNORMAL LOW (ref 3.5–5.1)
Sodium: 132 mmol/L — ABNORMAL LOW (ref 135–145)

## 2019-02-14 LAB — CBC
HCT: 31.2 % — ABNORMAL LOW (ref 36.0–46.0)
Hemoglobin: 10.7 g/dL — ABNORMAL LOW (ref 12.0–15.0)
MCH: 30.5 pg (ref 26.0–34.0)
MCHC: 34.3 g/dL (ref 30.0–36.0)
MCV: 88.9 fL (ref 80.0–100.0)
Platelets: 153 10*3/uL (ref 150–400)
RBC: 3.51 MIL/uL — ABNORMAL LOW (ref 3.87–5.11)
RDW: 13 % (ref 11.5–15.5)
WBC: 10 10*3/uL (ref 4.0–10.5)
nRBC: 0 % (ref 0.0–0.2)

## 2019-02-14 LAB — URINALYSIS, COMPLETE (UACMP) WITH MICROSCOPIC
Bilirubin Urine: NEGATIVE
Glucose, UA: NEGATIVE mg/dL
Ketones, ur: 20 mg/dL — AB
Nitrite: NEGATIVE
Protein, ur: 100 mg/dL — AB
Specific Gravity, Urine: 1.014 (ref 1.005–1.030)
WBC, UA: >50 WBC/hpf — ABNORMAL HIGH (ref 0–5)
pH: 5 (ref 5.0–8.0)

## 2019-02-14 LAB — GLUCOSE, CAPILLARY
Glucose-Capillary: 71 mg/dL (ref 70–99)
Glucose-Capillary: 43 mg/dL — CL (ref 70–99)
Glucose-Capillary: 48 mg/dL — ABNORMAL LOW (ref 70–99)
Glucose-Capillary: 107 mg/dL — ABNORMAL HIGH (ref 70–99)
Glucose-Capillary: 54 mg/dL — ABNORMAL LOW (ref 70–99)
Glucose-Capillary: 57 mg/dL — ABNORMAL LOW (ref 70–99)
Glucose-Capillary: 230 mg/dL — ABNORMAL HIGH (ref 70–99)
Glucose-Capillary: 176 mg/dL — ABNORMAL HIGH (ref 70–99)

## 2019-02-14 LAB — MAGNESIUM: Magnesium: 1.8 mg/dL (ref 1.7–2.4)

## 2019-02-14 LAB — SARS CORONAVIRUS 2 (TAT 6-24 HRS): SARS Coronavirus 2: NEGATIVE

## 2019-02-14 LAB — LACTIC ACID, PLASMA: Lactic Acid, Venous: 0.7 mmol/L (ref 0.5–1.9)

## 2019-02-14 MED ORDER — INSULIN GLARGINE 100 UNIT/ML ~~LOC~~ SOLN
22.0000 [IU] | Freq: Every day | SUBCUTANEOUS | Status: DC
  Filled 2019-02-14 (×2): qty 0.22

## 2019-02-14 MED ORDER — IOHEXOL 9 MG/ML PO SOLN
1000.0000 mL | Freq: Once | ORAL | Status: AC | PRN
  Administered 2019-02-14: 15:00:00 1000 mL via ORAL

## 2019-02-14 MED ORDER — SODIUM CHLORIDE 0.9 % IV BOLUS
1000.0000 mL | Freq: Once | INTRAVENOUS | Status: AC
  Administered 2019-02-14: 1000 mL via INTRAVENOUS

## 2019-02-14 MED ORDER — OXYCODONE-ACETAMINOPHEN 5-325 MG PO TABS
1.0000 | ORAL_TABLET | ORAL | Status: DC | PRN
  Administered 2019-02-14 – 2019-02-15 (×5): 1 via ORAL
  Filled 2019-02-14 (×5): qty 1

## 2019-02-14 MED ORDER — SODIUM CHLORIDE 0.9 % IV BOLUS
1000.0000 mL | Freq: Once | INTRAVENOUS | Status: AC
  Administered 2019-02-14: 08:00:00 1000 mL via INTRAVENOUS

## 2019-02-14 MED ORDER — IOHEXOL 9 MG/ML PO SOLN: 500.0000 mL | Freq: Once | ORAL | Status: DC | PRN

## 2019-02-14 MED ORDER — SODIUM CHLORIDE 0.9 % IV SOLN
1.0000 g | INTRAVENOUS | Status: DC
  Administered 2019-02-14: 1 g via INTRAVENOUS
  Filled 2019-02-14: qty 10
  Filled 2019-02-14: qty 1

## 2019-02-14 MED ORDER — INSULIN GLARGINE 100 UNIT/ML ~~LOC~~ SOLN
18.0000 [IU] | Freq: Every day | SUBCUTANEOUS | Status: DC
  Administered 2019-02-14: 21:00:00 18 [IU] via SUBCUTANEOUS
  Filled 2019-02-14 (×2): qty 0.18

## 2019-02-14 MED ORDER — POTASSIUM CHLORIDE CRYS ER 20 MEQ PO TBCR
40.0000 meq | EXTENDED_RELEASE_TABLET | ORAL | Status: AC
  Administered 2019-02-14 (×2): 40 meq via ORAL
  Filled 2019-02-14 (×2): qty 2

## 2019-02-14 MED ORDER — DEXTROSE 50 % IV SOLN
25.0000 mL | Freq: Once | INTRAVENOUS | Status: AC
  Administered 2019-02-14: 13:00:00 25 mL via INTRAVENOUS
  Filled 2019-02-14: qty 50

## 2019-02-14 NOTE — Progress Notes (Signed)
[place] at [place] NAME: Holly Martin    MR#:  [id number]  DATE OF BIRTH:  [birth year]  SUBJECTIVE:  CHIEF COMPLAINT:   Chief Complaint  Patient presents with  . Emesis  . Hyperglycemia  . Weakness   Feels better but still has nausea.  Had mild abdominal pain overnight. Febrile  Frequent urination  REVIEW OF SYSTEMS:    Review of Systems  Constitutional: Negative for chills and fever.  HENT: Negative for sore throat.   Eyes: Negative for blurred vision, double vision and pain.  Respiratory: Negative for cough, hemoptysis, shortness of breath and wheezing.   Cardiovascular: Negative for chest pain, palpitations, orthopnea and leg swelling.  Gastrointestinal: Negative for abdominal pain, constipation, diarrhea, heartburn, nausea and vomiting.  Genitourinary: Negative for dysuria and hematuria.  Musculoskeletal: Negative for back pain and joint pain.  Skin: Negative for rash.  Neurological: Negative for sensory change, speech change, focal weakness and headaches.  Endo/Heme/Allergies: Does not bruise/bleed easily.  Psychiatric/Behavioral: Negative for depression. The patient is not nervous/anxious.     DRUG ALLERGIES:  No Known Allergies  VITALS:  Blood pressure 125/81, pulse (!) 126, temperature 100.2 F (37.9 C), temperature source Oral, resp. rate 17, height 5\' 5"  (1.651 m), weight 79.4 kg, last menstrual period 02/05/2019, SpO2 96 %.  PHYSICAL EXAMINATION:   Physical Exam  GENERAL:  23 y.o.-year-old patient lying in the bed with no acute distress.  EYES: Pupils equal, round, reactive to light and accommodation. No scleral icterus. Extraocular muscles intact.  HEENT: Head atraumatic, normocephalic. Oropharynx and nasopharynx clear.  NECK:  Supple, no jugular venous distention. No thyroid enlargement, no tenderness.  LUNGS: Normal breath sounds bilaterally, no wheezing, rales, rhonchi. No use of accessory muscles of  respiration.  CARDIOVASCULAR: S1, S2.  Tachycardia ABDOMEN: Soft, lower abdominal tenderness, nondistended. Bowel sounds present. No organomegaly or mass.  EXTREMITIES: No cyanosis, clubbing or edema b/l.    NEUROLOGIC: Cranial nerves II through XII are intact. No focal Motor or sensory deficits b/l.   PSYCHIATRIC: The patient is alert and oriented x 3.  SKIN: No obvious rash, lesion, or ulcer.   LABORATORY PANEL:   CBC Recent Labs  Lab 02/14/19 0801  WBC 10.0  HGB 10.7*  HCT 31.2*  PLT 153   ------------------------------------------------------------------------------------------------------------------ Chemistries  Recent Labs  Lab 02/13/19 1332  02/14/19 0801  NA 128*   < > 132*  K 4.0   < > 3.3*  CL 90*   < > 104  CO2 21*   < > 16*  GLUCOSE 381*   < > 99  BUN 38*   < > 28*  CREATININE 2.63*   < > 1.55*  CALCIUM 9.2   < > 7.9*  AST 16  --   --   ALT 17  --   --   ALKPHOS 134*  --   --   BILITOT 0.8  --   --    < > = values in this interval not displayed.   ------------------------------------------------------------------------------------------------------------------  Cardiac Enzymes No results for input(s): TROPONINI in the last 168 hours. ------------------------------------------------------------------------------------------------------------------  RADIOLOGY:  No results found.   ASSESSMENT AND PLAN:   *UTI with sepsis We will start IV ceftriaxone.  Fluid bolus ordered stat.  Check lactic acid. Ordered urine cultures  *DKA Resolved.  Poor appetite.  Will decrease dose of Lantus.  Continue sliding scale insulin.  Diabetic diet.  *Acute kidney injury secondary to dehydration, DKA.  Continue IV fluids.  Monitor input and output. Improving. Repeat labs in the morning.  *Hyponatremia secondary to dehydration.   Improving.  Continue IV fluids.  *Hypokalemia.  Will replace orally.  *DVT prophylaxis with Lovenox  All the records are  reviewed and case discussed with Care Management/Social Worker Management plans discussed with the patient, family and they are in agreement.  CODE STATUS: Full code  DVT Prophylaxis: SCDs  TOTAL CRITICAL CARE TIME TAKING CARE OF THIS PATIENT: 35 minutes.   POSSIBLE D/C IN 1-2 DAYS, DEPENDING ON CLINICAL CONDITION.  [name] M.D on 02/14/2019 at 11:06 AM  Between 7am to 6pm - Pager - [phone]  After 6pm go to [email or web address] - password [email or web address] [facility]  Office  [phone]  CC: Primary care physician; Patient, No Pcp Per  Note: This dictation was prepared with Dragon dictation along with smaller phrase technology. Any transcriptional errors that result from this process are unintentional.

## 2019-02-14 NOTE — Progress Notes (Signed)
Inpatient Diabetes Program Recommendations  AACE/ADA: New Consensus Statement on Inpatient Glycemic Control (2015)  Target Ranges:  Prepandial:   less than 140 mg/dL      Peak postprandial:   less than 180 mg/dL (1-2 hours)      Critically ill patients:  140 - 180 mg/dL   Lab Results  Component Value Date   GLUCAP 43 (LL) 02/14/2019    Review of Glycemic Control  Diabetes history: DM1 (Makes NO insulin so will require basal, meal coverage and correction) Outpatient Diabetes medications: Basaglar 30 units + Novolog up to total of 30 units daily Current orders for Inpatient glycemic control: Lantus 22 (requested pharmacy to change to q hs due to last received last hs)   Inpatient Diabetes Program Recommendations:    -Change Lantus to q hs starting tonight -Decrease Novolog correction to sensitive tid + hs 0-5 units -Add Novolog 3-4 units tid meal coverage if eats 50%  Spoke with patient @ bedside and reviewed sick day rules. Patient states she took her Basaglar Saturday and Sunday and correction tid for elevated CBGs. Reviewed with patient to assure patient is rotating insulin injection sites. Patient states she rotates sites but does not use her abdominal sites on a regular basis. Reviewed to check for scar tissue or hardened areas and avoid these areas. Patient has appointment with Dr. Rich Reining regarding her diabetes in the future.  Thank you, Nani Gasser. Alexiya Franqui, RN, MSN, CDE  Diabetes Coordinator Inpatient Glycemic Control Team Team Pager [phone] (8am-5pm) 02/14/2019 12:23 PM

## 2019-02-14 NOTE — Plan of Care (Signed)
  Problem: Education: Goal: Knowledge of General Education information will improve Description: Including pain rating scale, medication(s)/side effects and non-pharmacologic comfort measures Outcome: Completed/Met

## 2019-02-15 ENCOUNTER — Inpatient Hospital Stay: Payer: BC Managed Care – PPO

## 2019-02-15 LAB — CBC WITH DIFFERENTIAL/PLATELET
Abs Immature Granulocytes: 0.25 10*3/uL — ABNORMAL HIGH (ref 0.00–0.07)
Basophils Absolute: 0.1 10*3/uL (ref 0.0–0.1)
Basophils Relative: 0 %
Eosinophils Absolute: 0.1 10*3/uL (ref 0.0–0.5)
Eosinophils Relative: 1 %
HCT: 32.4 % — ABNORMAL LOW (ref 36.0–46.0)
Hemoglobin: 10.9 g/dL — ABNORMAL LOW (ref 12.0–15.0)
Immature Granulocytes: 2 %
Lymphocytes Relative: 18 %
Lymphs Abs: 2.8 10*3/uL (ref 0.7–4.0)
MCH: 29.9 pg (ref 26.0–34.0)
MCHC: 33.6 g/dL (ref 30.0–36.0)
MCV: 88.8 fL (ref 80.0–100.0)
Monocytes Absolute: 2.1 10*3/uL — ABNORMAL HIGH (ref 0.1–1.0)
Monocytes Relative: 14 %
Neutro Abs: 10.4 10*3/uL — ABNORMAL HIGH (ref 1.7–7.7)
Neutrophils Relative %: 65 %
Platelets: 204 10*3/uL (ref 150–400)
RBC: 3.65 MIL/uL — ABNORMAL LOW (ref 3.87–5.11)
RDW: 13.5 % (ref 11.5–15.5)
Smear Review: NORMAL
WBC: 15.7 10*3/uL — ABNORMAL HIGH (ref 4.0–10.5)
nRBC: 0 % (ref 0.0–0.2)

## 2019-02-15 LAB — COMPREHENSIVE METABOLIC PANEL
ALT: 28 U/L (ref 0–44)
AST: 38 U/L (ref 15–41)
Albumin: 2.4 g/dL — ABNORMAL LOW (ref 3.5–5.0)
Alkaline Phosphatase: 127 U/L — ABNORMAL HIGH (ref 38–126)
Anion gap: 8 (ref 5–15)
BUN: 12 mg/dL (ref 6–20)
CO2: 20 mmol/L — ABNORMAL LOW (ref 22–32)
Calcium: 7.9 mg/dL — ABNORMAL LOW (ref 8.9–10.3)
Chloride: 108 mmol/L (ref 98–111)
Creatinine, Ser: 1.14 mg/dL — ABNORMAL HIGH (ref 0.44–1.00)
GFR calc Af Amer: >60 mL/min (ref >60)
GFR calc non Af Amer: >60 mL/min (ref >60)
Glucose, Bld: 55 mg/dL — ABNORMAL LOW (ref 70–99)
Potassium: 3.2 mmol/L — ABNORMAL LOW (ref 3.5–5.1)
Sodium: 136 mmol/L (ref 135–145)
Total Bilirubin: 0.5 mg/dL (ref 0.3–1.2)
Total Protein: 5.9 g/dL — ABNORMAL LOW (ref 6.5–8.1)

## 2019-02-15 LAB — HIV ANTIBODY (ROUTINE TESTING W REFLEX): HIV Screen 4th Generation wRfx: NONREACTIVE

## 2019-02-15 LAB — GLUCOSE, CAPILLARY
Glucose-Capillary: 44 mg/dL — CL (ref 70–99)
Glucose-Capillary: 65 mg/dL — ABNORMAL LOW (ref 70–99)
Glucose-Capillary: 161 mg/dL — ABNORMAL HIGH (ref 70–99)
Glucose-Capillary: 164 mg/dL — ABNORMAL HIGH (ref 70–99)
Glucose-Capillary: 119 mg/dL — ABNORMAL HIGH (ref 70–99)
Glucose-Capillary: 173 mg/dL — ABNORMAL HIGH (ref 70–99)

## 2019-02-15 LAB — HEMOGLOBIN A1C
Hgb A1c MFr Bld: 8.6 % — ABNORMAL HIGH (ref 4.8–5.6)
Mean Plasma Glucose: 200 mg/dL

## 2019-02-15 LAB — TSH: TSH: 2.414 u[IU]/mL (ref 0.350–4.500)

## 2019-02-15 MED ORDER — POTASSIUM CHLORIDE CRYS ER 20 MEQ PO TBCR
40.0000 meq | EXTENDED_RELEASE_TABLET | ORAL | Status: AC
  Administered 2019-02-15 (×2): 40 meq via ORAL
  Filled 2019-02-15 (×2): qty 2

## 2019-02-15 MED ORDER — DEXTROSE 50 % IV SOLN
25.0000 mL | Freq: Once | INTRAVENOUS | Status: AC
  Administered 2019-02-15: 08:00:00 25 mL via INTRAVENOUS
  Filled 2019-02-15: qty 50

## 2019-02-15 MED ORDER — METOCLOPRAMIDE HCL 5 MG/ML IJ SOLN
5.0000 mg | Freq: Three times a day (TID) | INTRAMUSCULAR | Status: DC
  Administered 2019-02-15 – 2019-02-16 (×3): 5 mg via INTRAVENOUS
  Filled 2019-02-15 (×3): qty 2

## 2019-02-15 MED ORDER — INSULIN GLARGINE 100 UNIT/ML ~~LOC~~ SOLN
10.0000 [IU] | Freq: Every day | SUBCUTANEOUS | Status: DC
  Filled 2019-02-15 (×2): qty 0.1

## 2019-02-15 MED ORDER — INSULIN ASPART 100 UNIT/ML ~~LOC~~ SOLN
0.0000 [IU] | Freq: Three times a day (TID) | SUBCUTANEOUS | Status: DC
  Administered 2019-02-15: 12:00:00 2 [IU] via SUBCUTANEOUS
  Administered 2019-02-16: 5 [IU] via SUBCUTANEOUS
  Administered 2019-02-16 (×2): 2 [IU] via SUBCUTANEOUS
  Administered 2019-02-17: 08:00:00 5 [IU] via SUBCUTANEOUS
  Filled 2019-02-15 (×5): qty 1

## 2019-02-15 MED ORDER — PIPERACILLIN-TAZOBACTAM 3.375 G IVPB
3.3750 g | Freq: Three times a day (TID) | INTRAVENOUS | Status: DC
  Administered 2019-02-15 – 2019-02-17 (×7): 3.375 g via INTRAVENOUS
  Filled 2019-02-15 (×7): qty 50

## 2019-02-15 NOTE — Consult Note (Addendum)
Pharmacy Antibiotic Note  Holly Martin is a 23 y.o. female admitted on 02/13/2019 with aspiration pneumonia. Patient is a type 1 diabetic admitted for DKA. Patient was started on ceftriaxone as a result of polyuria (though could be related to elevated glucose as well) and UA with many bacteria, leukocytosis,a nd large leukocytes. Last dose of CTX was yesterday. Pharmacy has been consulted for Zosyn dosing. CT abdominal scan from yesterday indicated bibasilar patchy consolidative changes indicative of pneumonia and accompanied with pleural effusion. WBC elevated from yesterday with overnight fevers.   Plan: Zosyn 3.375 g (EI) Q8H.   Height: 5\' 5"  (165.1 cm) Weight: 175 lb (79.4 kg) IBW/kg (Calculated) : 57  Temp (24hrs), Avg:99.4 F (37.4 C), Min:98.1 F (36.7 C), Max:100.8 F (38.2 C)  Recent Labs  Lab 02/13/19 1332 02/13/19 1840 02/14/19 0801 02/14/19 1045 02/15/19 0621  WBC 12.4*  --  10.0  --  15.7*  CREATININE 2.63* 2.13* 1.55*  --   --   LATICACIDVEN  --   --   --  0.7  --     Estimated Creatinine Clearance: 58.8 mL/min (A) (by C-G formula based on SCr of 1.55 mg/dL (H)).    No Known Allergies  Antimicrobials this admission: 9/10 CTX >> 9/11  Microbiology results: 9/10 UCx: GNR   Thank you for allowing pharmacy to be a part of this patient's care.  Rowland Lathe 02/15/2019 8:08 AM

## 2019-02-15 NOTE — Progress Notes (Signed)
[facility] NAME: Holly Martin    MR#:  [id number]  DATE OF BIRTH:  Jun 27, 1995  SUBJECTIVE:  CHIEF COMPLAINT:   Chief Complaint  Patient presents with  . Emesis  . Hyperglycemia  . Weakness   Continues to have nausea.  No vomiting.  Poor oral intake.  Had shortness of breath when she walked to the bathroom.  Breathing feels normal at rest.  Had to be placed on 5 L oxygen overnight.  Blood sugars have fluctuated from 45-200.  REVIEW OF SYSTEMS:    Review of Systems  Constitutional: Negative for chills and fever.  HENT: Negative for sore throat.   Eyes: Negative for blurred vision, double vision and pain.  Respiratory: Negative for cough, hemoptysis, shortness of breath and wheezing.   Cardiovascular: Negative for chest pain, palpitations, orthopnea and leg swelling.  Gastrointestinal: Negative for abdominal pain, constipation, diarrhea, heartburn, nausea and vomiting.  Genitourinary: Negative for dysuria and hematuria.  Musculoskeletal: Negative for back pain and joint pain.  Skin: Negative for rash.  Neurological: Negative for sensory change, speech change, focal weakness and headaches.  Endo/Heme/Allergies: Does not bruise/bleed easily.  Psychiatric/Behavioral: Negative for depression. The patient is not nervous/anxious.     DRUG ALLERGIES:  No Known Allergies  VITALS:  Blood pressure 128/86, pulse (!) 124, temperature 99.6 F (37.6 C), temperature source Oral, resp. rate 20, height 5\' 5"  (1.651 m), weight 79.4 kg, last menstrual period 02/05/2019, SpO2 96 %.  PHYSICAL EXAMINATION:   Physical Exam  GENERAL:  23 y.o.-year-old patient lying in the bed with no acute distress.  EYES: Pupils equal, round, reactive to light and accommodation. No scleral icterus. Extraocular muscles intact.  HEENT: Head atraumatic, normocephalic. Oropharynx and nasopharynx clear.  NECK:  Supple, no jugular venous distention. No thyroid  enlargement, no tenderness.  LUNGS: Decreased breath sounds bilaterally CARDIOVASCULAR: S1, S2.  Tachycardia ABDOMEN: Soft, lower abdominal tenderness, nondistended. Bowel sounds present. No organomegaly or mass.  EXTREMITIES: No cyanosis, clubbing or edema b/l.    NEUROLOGIC: Cranial nerves II through XII are intact. No focal Motor or sensory deficits b/l.   PSYCHIATRIC: The patient is alert and oriented x 3.  SKIN: No obvious rash, lesion, or ulcer.   LABORATORY PANEL:   CBC Recent Labs  Lab 02/15/19 0621  WBC 15.7*  HGB 10.9*  HCT 32.4*  PLT 204   ------------------------------------------------------------------------------------------------------------------ Chemistries  Recent Labs  Lab 02/14/19 0801 02/15/19 0713  NA 132* 136  K 3.3* 3.2*  CL 104 108  CO2 16* 20*  GLUCOSE 99 55*  BUN 28* 12  CREATININE 1.55* 1.14*  CALCIUM 7.9* 7.9*  MG 1.8  --   AST  --  38  ALT  --  28  ALKPHOS  --  127*  BILITOT  --  0.5   ------------------------------------------------------------------------------------------------------------------  Cardiac Enzymes No results for input(s): TROPONINI in the last 168 hours. ------------------------------------------------------------------------------------------------------------------  RADIOLOGY:  Ct Abdomen Pelvis Wo Contrast  Result Date: 02/14/2019 CLINICAL DATA:  23 year old female with nausea and vomiting. Concern for gallstones. EXAM: CT ABDOMEN AND PELVIS WITHOUT CONTRAST TECHNIQUE: Multidetector CT imaging of the abdomen and pelvis was performed following the standard protocol without IV contrast. COMPARISON:  None. FINDINGS: Evaluation of this exam is limited in the absence of intravenous contrast. Evaluation is also limited due to streak artifact caused by metallic spinal fixation hardware. Lower chest: Bibasilar patchy consolidative changes may represent pneumonia. Clinical correlation is recommended. Trace bilateral pleural  effusions may be present. There is hypoattenuation of the cardiac blood pool suggestive of a degree of anemia. Clinical correlation is recommended. No intra-abdominal free air. Trace free fluid within the pelvis. Hepatobiliary: Apparent fatty infiltration of the liver. No intrahepatic biliary ductal dilatation. Layering sludge or small stones versus hyper concentrated bile noted within the gallbladder. Trace pericholecystic fluid may be present. Ultrasound may provide better evaluation of the gallbladder if there is high clinical concern for acute cholecystitis. Pancreas: Unremarkable. No pancreatic ductal dilatation or surrounding inflammatory changes. Spleen: Top-normal spleen size measuring up to 14 cm. Adrenals/Urinary Tract: The left adrenal gland is unremarkable. The right adrenal gland is suboptimally visualized. There is no hydronephrosis or nephrolithiasis on either side. Minimal perinephric stranding noted. Correlation with urinalysis recommended to exclude UTI. The urinary bladder is unremarkable. Stomach/Bowel: There is no bowel obstruction or active inflammation. The appendix is normal. Vascular/Lymphatic: The abdominal aorta and IVC are grossly unremarkable on this noncontrast CT. No portal venous gas. There is no adenopathy. Reproductive: The uterus is anteverted and grossly unremarkable. No adnexal masses. Other: Small fat containing umbilical hernia. Musculoskeletal: Lumbar levoscoliosis. Partially visualized posterior spinal Harrington rods. No acute osseous pathology. IMPRESSION: 1. Bibasilar densities may represent pneumonia. Clinical correlation is recommended. 2. Layering sludge or small stones versus hyper-concentrated bile within the gallbladder. Ultrasound may provide better evaluation of the gallbladder if there is high clinical concern for acute cholecystitis. No hydronephrosis or nephrolithiasis. Correlation with urinalysis recommended to exclude UTI. 3. No bowel obstruction or active  inflammation. Normal appendix. Electronically Signed   By: Anner Crete M.D.   On: 02/14/2019 17:34   Dg Chest 2 View  Result Date: 02/15/2019 CLINICAL DATA:  Shortness of breath, fevers EXAM: CHEST - 2 VIEW COMPARISON:  05/29/2007 FINDINGS: The heart size and mediastinal contours are within normal limits. Bibasilar heterogeneous airspace opacity and small pleural effusions. Posterior thoracolumbar fusion. IMPRESSION: Bibasilar heterogeneous airspace opacity and small pleural effusions, concerning for infection. Electronically Signed   By: Eddie Candle M.D.   On: 02/15/2019 11:13     ASSESSMENT AND PLAN:   *Acute hypoxic respiratory failure secondary to aspiration pneumonia Chest x-ray shows bibasilar infiltrates. Start IV Zosyn Incentive spirometer  *UTI with sepsis Started on IV ceftriaxone.  Changed to Zosyn today due to aspiration pneumonia. Culture sent and pending.  *DKA Resolved.  Poor appetite.   Now patient has hypoglycemic episodes.  Will decrease Lantus dose further. Sliding scale insulin change from moderate to sensitive.  *Nausea and vomiting.  Likely due to acute illness.  Could be gastroparesis.  Will add scheduled Reglan.  *Acute kidney injury secondary to dehydration, DKA.   Resolved  *Hyponatremia secondary to dehydration.   Improving.  Continue IV fluids.  *Hypokalemia.  Will replace orally.  *DVT prophylaxis with Lovenox  All the records are reviewed and case discussed with Care Management/Social Worker Management plans discussed with the patient, family and they are in agreement.  CODE STATUS: Full code  DVT Prophylaxis: SCDs  TOTAL TIME TAKING CARE OF THIS PATIENT: 35 minutes.   POSSIBLE D/C IN 1-2 DAYS, DEPENDING ON CLINICAL CONDITION.  Neita Carp M.D on 02/15/2019 at 11:20 AM  Between 7am to 6pm - Pager - [phone]  After 6pm go to [email or web address] - password [email or web address] [facility]  Office   [phone]  CC: Primary care physician; Patient, No Pcp Per  Note: This dictation was prepared with Dragon dictation along with smaller phrase technology. Any transcriptional errors that result  from this process are unintentional.

## 2019-02-15 NOTE — Progress Notes (Signed)
Pt oxygen level was 69 this am. After applying 2L [place] pt oxygen was still 80%. Pt is currently up to 4L and is 91% on the 4L. Pt HR is been in the 130s the last 30 minutes. Now pt HR is 120s. Pt report no distress at this time. Will continue to monitor.

## 2019-02-16 LAB — CBC WITH DIFFERENTIAL/PLATELET
Abs Immature Granulocytes: 0.34 10*3/uL — ABNORMAL HIGH (ref 0.00–0.07)
Basophils Absolute: 0 10*3/uL (ref 0.0–0.1)
Basophils Relative: 0 %
Eosinophils Absolute: 0.1 10*3/uL (ref 0.0–0.5)
Eosinophils Relative: 1 %
HCT: 29.2 % — ABNORMAL LOW (ref 36.0–46.0)
Hemoglobin: 9.8 g/dL — ABNORMAL LOW (ref 12.0–15.0)
Immature Granulocytes: 4 %
Lymphocytes Relative: 27 %
Lymphs Abs: 2.4 10*3/uL (ref 0.7–4.0)
MCH: 30.2 pg (ref 26.0–34.0)
MCHC: 33.6 g/dL (ref 30.0–36.0)
MCV: 89.8 fL (ref 80.0–100.0)
Monocytes Absolute: 1 10*3/uL (ref 0.1–1.0)
Monocytes Relative: 12 %
Neutro Abs: 5 10*3/uL (ref 1.7–7.7)
Neutrophils Relative %: 56 %
Platelets: 180 10*3/uL (ref 150–400)
RBC: 3.25 MIL/uL — ABNORMAL LOW (ref 3.87–5.11)
RDW: 14.4 % (ref 11.5–15.5)
Smear Review: NORMAL
WBC: 8.8 10*3/uL (ref 4.0–10.5)
nRBC: 0 % (ref 0.0–0.2)

## 2019-02-16 LAB — BASIC METABOLIC PANEL
Anion gap: 10 (ref 5–15)
BUN: 10 mg/dL (ref 6–20)
CO2: 21 mmol/L — ABNORMAL LOW (ref 22–32)
Calcium: 8.1 mg/dL — ABNORMAL LOW (ref 8.9–10.3)
Chloride: 106 mmol/L (ref 98–111)
Creatinine, Ser: 0.98 mg/dL (ref 0.44–1.00)
GFR calc Af Amer: >60 mL/min (ref >60)
GFR calc non Af Amer: >60 mL/min (ref >60)
Glucose, Bld: 205 mg/dL — ABNORMAL HIGH (ref 70–99)
Potassium: 4.2 mmol/L (ref 3.5–5.1)
Sodium: 137 mmol/L (ref 135–145)

## 2019-02-16 LAB — URINE CULTURE: Culture: >=100000 — AB

## 2019-02-16 LAB — GLUCOSE, CAPILLARY
Glucose-Capillary: 171 mg/dL — ABNORMAL HIGH (ref 70–99)
Glucose-Capillary: 171 mg/dL — ABNORMAL HIGH (ref 70–99)
Glucose-Capillary: 276 mg/dL — ABNORMAL HIGH (ref 70–99)
Glucose-Capillary: 287 mg/dL — ABNORMAL HIGH (ref 70–99)

## 2019-02-16 MED ORDER — POLYETHYLENE GLYCOL 3350 17 G PO PACK
17.0000 g | PACK | Freq: Every day | ORAL | Status: DC
  Administered 2019-02-16: 17 g via ORAL
  Filled 2019-02-16: qty 1

## 2019-02-16 MED ORDER — METOCLOPRAMIDE HCL 5 MG/ML IJ SOLN
5.0000 mg | Freq: Four times a day (QID) | INTRAMUSCULAR | Status: DC
  Administered 2019-02-16 – 2019-02-17 (×4): 5 mg via INTRAVENOUS
  Filled 2019-02-16 (×4): qty 2

## 2019-02-16 MED ORDER — INSULIN GLARGINE 100 UNIT/ML ~~LOC~~ SOLN
6.0000 [IU] | Freq: Every day | SUBCUTANEOUS | Status: DC
  Administered 2019-02-16: 6 [IU] via SUBCUTANEOUS
  Filled 2019-02-16 (×2): qty 0.06

## 2019-02-16 NOTE — Progress Notes (Signed)
Patient ID: Holly Martin, female   DOB: 10/06/1995, 23 y.o.   MRN: [id number]  [facility] PROGRESS NOTE  Holly Martin [id number] DOB: 13-May-1996 DOA: 02/13/2019 PCP: Patient, No Pcp Per  HPI/Subjective: Patient has poor appetite.  Not eating very well.  Still on some oxygen this morning.  They did not give the patient's Lantus last night.  Objective: Vitals:   02/16/19 0005 02/16/19 0427  BP:  115/84  Pulse:  100  Resp:  18  Temp:  97.9 F (36.6 C)  SpO2: 92% 100%    Intake/Output Summary (Last 24 hours) at 02/16/2019 1252 Last data filed at 02/16/2019 1208 Gross per 24 hour  Intake 4029.28 ml  Output 1800 ml  Net 2229.28 ml   Filed Weights   02/13/19 1305  Weight: 79.4 kg    ROS: Review of Systems  Constitutional: Negative for chills and fever.  Eyes: Negative for blurred vision.  Respiratory: Negative for cough and shortness of breath.   Cardiovascular: Negative for chest pain.  Gastrointestinal: Positive for nausea. Negative for abdominal pain, constipation, diarrhea and vomiting.  Genitourinary: Negative for dysuria.  Musculoskeletal: Negative for joint pain.  Neurological: Negative for dizziness and headaches.   Exam: Physical Exam  Constitutional: She is oriented to person, place, and time.  HENT:  Nose: No mucosal edema.  Mouth/Throat: No oropharyngeal exudate or posterior oropharyngeal edema.  Eyes: Pupils are equal, round, and reactive to light. Conjunctivae, EOM and lids are normal.  Neck: No JVD present. Carotid bruit is not present. No edema present. No thyroid mass and no thyromegaly present.  Cardiovascular: S1 normal and S2 normal. Exam reveals no gallop.  No murmur heard. Pulses:      Dorsalis pedis pulses are 2+ on the right side and 2+ on the left side.  Respiratory: No respiratory distress. She has no wheezes. She has no rhonchi. She has no rales.  GI: Soft. Bowel sounds are normal. There is no abdominal tenderness.   Musculoskeletal:     Right ankle: She exhibits no swelling.     Left ankle: She exhibits no swelling.  Lymphadenopathy:    She has no cervical adenopathy.  Neurological: She is alert and oriented to person, place, and time. No cranial nerve deficit.  Skin: Skin is warm. No rash noted. Nails show no clubbing.  Psychiatric: She has a normal mood and affect.      Data Reviewed: Basic Metabolic Panel: Recent Labs  Lab 02/13/19 1332 02/13/19 1840 02/14/19 0801 02/15/19 0713 02/16/19 0721  NA 128* 129* 132* 136 137  K 4.0 3.7 3.3* 3.2* 4.2  CL 90* 96* 104 108 106  CO2 21* 20* 16* 20* 21*  GLUCOSE 381* 269* 99 55* 205*  BUN 38* 37* 28* 12 10  CREATININE 2.63* 2.13* 1.55* 1.14* 0.98  CALCIUM 9.2 8.4* 7.9* 7.9* 8.1*  MG  --   --  1.8  --   --    Liver Function Tests: Recent Labs  Lab 02/13/19 1332 02/15/19 0713  AST 16 38  ALT 17 28  ALKPHOS 134* 127*  BILITOT 0.8 0.5  PROT 8.5* 5.9*  ALBUMIN 3.8 2.4*   Recent Labs  Lab 02/13/19 1332  LIPASE 22   CBC: Recent Labs  Lab 02/13/19 1332 02/14/19 0801 02/15/19 0621 02/16/19 0721  WBC 12.4* 10.0 15.7* 8.8  NEUTROABS  --   --  10.4* 5.0  HGB 14.0 10.7* 10.9* 9.8*  HCT 40.8 31.2* 32.4* 29.2*  MCV 88.5 88.9 88.8  89.8  PLT 176 153 204 180    CBG: Recent Labs  Lab 02/15/19 1143 02/15/19 1644 02/15/19 2113 02/16/19 0748 02/16/19 1146  GLUCAP 164* 119* 173* 171* 171*    Recent Results (from the past 240 hour(s))  SARS CORONAVIRUS 2 (TAT 6-24 HRS) Nasopharyngeal Nasopharyngeal Swab     Status: None   Collection Time: 02/13/19  6:02 PM   Specimen: Nasopharyngeal Swab  Result Value Ref Range Status   SARS Coronavirus 2 NEGATIVE NEGATIVE Final    Comment: (NOTE) SARS-CoV-2 target nucleic acids are NOT DETECTED. The SARS-CoV-2 RNA is generally detectable in upper and lower respiratory specimens during the acute phase of infection. Negative results do not preclude SARS-CoV-2 infection, do not rule  out co-infections with other pathogens, and should not be used as the sole basis for treatment or other patient management decisions. Negative results must be combined with clinical observations, patient history, and epidemiological information. The expected result is Negative. Fact Sheet for Patients: SugarRoll.be Fact Sheet for Healthcare Providers: https://www.woods-mathews.com/ This test is not yet approved or cleared by the [place] FDA and  has been authorized for detection and/or diagnosis of SARS-CoV-2 by FDA under an Emergency Use Authorization (EUA). This EUA will remain  in effect (meaning this test can be used) for the duration of the COVID-19 declaration under Section 56 4(b)(1) of the Act, 21 U.S.C. section 360bbb-3(b)(1), unless the authorization is terminated or revoked sooner. Performed at [facility], [place] [address] [place], [place] [address]   Urine Culture     Status: Abnormal   Collection Time: 02/14/19  7:46 AM   Specimen: Urine, Random  Result Value Ref Range Status   Specimen Description   Final    URINE, RANDOM Performed at [facility], [address] [place], [place] [address]    Special Requests   Final    NONE Performed at [facility], [address] [place], [place] [address]    Culture >=100,000 COLONIES/mL ESCHERICHIA COLI (A)  Final   Report Status 02/16/2019 FINAL  Final   Organism ID, Bacteria ESCHERICHIA COLI (A)  Final      Susceptibility   Escherichia coli - MIC*    AMPICILLIN >=32 RESISTANT Resistant     CEFAZOLIN <=4 SENSITIVE Sensitive     CEFTRIAXONE <=1 SENSITIVE Sensitive     CIPROFLOXACIN <=0.25 SENSITIVE Sensitive     GENTAMICIN <=1 SENSITIVE Sensitive     IMIPENEM <=0.25 SENSITIVE Sensitive     NITROFURANTOIN <=16 SENSITIVE Sensitive     TRIMETH/SULFA <=20 SENSITIVE Sensitive     AMPICILLIN/SULBACTAM >=32 RESISTANT Resistant     PIP/TAZO <=4  SENSITIVE Sensitive     Extended ESBL NEGATIVE Sensitive     * >=100,000 COLONIES/mL ESCHERICHIA COLI     Studies: Ct Abdomen Pelvis Wo Contrast  Result Date: 02/14/2019 CLINICAL DATA:  23 year old female with nausea and vomiting. Concern for gallstones. EXAM: CT ABDOMEN AND PELVIS WITHOUT CONTRAST TECHNIQUE: Multidetector CT imaging of the abdomen and pelvis was performed following the standard protocol without IV contrast. COMPARISON:  None. FINDINGS: Evaluation of this exam is limited in the absence of intravenous contrast. Evaluation is also limited due to streak artifact caused by metallic spinal fixation hardware. Lower chest: Bibasilar patchy consolidative changes may represent pneumonia. Clinical correlation is recommended. Trace bilateral pleural effusions may be present. There is hypoattenuation of the cardiac blood pool suggestive of a degree of anemia. Clinical correlation is recommended. No intra-abdominal free air. Trace free  fluid within the pelvis. Hepatobiliary: Apparent fatty infiltration of the liver. No intrahepatic biliary ductal dilatation. Layering sludge or small stones versus hyper concentrated bile noted within the gallbladder. Trace pericholecystic fluid may be present. Ultrasound may provide better evaluation of the gallbladder if there is high clinical concern for acute cholecystitis. Pancreas: Unremarkable. No pancreatic ductal dilatation or surrounding inflammatory changes. Spleen: Top-normal spleen size measuring up to 14 cm. Adrenals/Urinary Tract: The left adrenal gland is unremarkable. The right adrenal gland is suboptimally visualized. There is no hydronephrosis or nephrolithiasis on either side. Minimal perinephric stranding noted. Correlation with urinalysis recommended to exclude UTI. The urinary bladder is unremarkable. Stomach/Bowel: There is no bowel obstruction or active inflammation. The appendix is normal. Vascular/Lymphatic: The abdominal aorta and IVC are  grossly unremarkable on this noncontrast CT. No portal venous gas. There is no adenopathy. Reproductive: The uterus is anteverted and grossly unremarkable. No adnexal masses. Other: Small fat containing umbilical hernia. Musculoskeletal: Lumbar levoscoliosis. Partially visualized posterior spinal Harrington rods. No acute osseous pathology. IMPRESSION: 1. Bibasilar densities may represent pneumonia. Clinical correlation is recommended. 2. Layering sludge or small stones versus hyper-concentrated bile within the gallbladder. Ultrasound may provide better evaluation of the gallbladder if there is high clinical concern for acute cholecystitis. No hydronephrosis or nephrolithiasis. Correlation with urinalysis recommended to exclude UTI. 3. No bowel obstruction or active inflammation. Normal appendix. Electronically Signed   By: Anner Crete M.D.   On: 02/14/2019 17:34   Dg Chest 2 View  Result Date: 02/15/2019 CLINICAL DATA:  Shortness of breath, fevers EXAM: CHEST - 2 VIEW COMPARISON:  05/29/2007 FINDINGS: The heart size and mediastinal contours are within normal limits. Bibasilar heterogeneous airspace opacity and small pleural effusions. Posterior thoracolumbar fusion. IMPRESSION: Bibasilar heterogeneous airspace opacity and small pleural effusions, concerning for infection. Electronically Signed   By: Eddie Candle M.D.   On: 02/15/2019 11:13   US Abdomen Limited Ruq  Result Date: 02/15/2019 CLINICAL DATA:  Nausea and vomiting EXAM: ULTRASOUND ABDOMEN LIMITED RIGHT UPPER QUADRANT COMPARISON:  CT from the previous day. FINDINGS: Gallbladder: No gallstones or wall thickening visualized. No sonographic Murphy sign noted by sonographer. Common bile duct: Diameter: 3.1 mm Liver: No focal lesion identified. Within normal limits in parenchymal echogenicity. Portal vein is patent on color Doppler imaging with normal direction of blood flow towards the liver. Other: None. IMPRESSION: No gallbladder sludge or  gallstones are seen. The increased density in the gallbladder on prior exam may have been related to hyperdense bile. Electronically Signed   By: Inez Catalina M.D.   On: 02/15/2019 11:22    Scheduled Meds: . enoxaparin (LOVENOX) injection  40 mg Subcutaneous Q24H  . insulin aspart  0-9 Units Subcutaneous TID WC  . insulin glargine  6 Units Subcutaneous Daily  . metoCLOPramide (REGLAN) injection  5 mg Intravenous Q6H  . polyethylene glycol  17 g Oral Daily   Continuous Infusions: . sodium chloride 75 mL/hr at 02/16/19 1205  . piperacillin-tazobactam (ZOSYN)  IV Stopped (02/16/19 1154)    Assessment/Plan:  1. Acute hypoxic respiratory failure secondary to aspiration pneumonia.  On IV Zosyn.  Advised patient to walk around and use the incentive spirometer to get off the oxygen.  Patient down to 2 L. 2. UTI with sepsis, present on admission on Zosyn. 3. DKA.  This has resolved.  Decrease Lantus down to 6 units during the day.  Lantus was held last night.  4. Acute kidney injury secondary to dehydration 5. Hyponatremia and hypokalemia.  Electrolytes better. 6. Nausea vomiting.  Seems better this morning.  Continue Reglan.  Gallbladder imaging unremarkable on ultrasound.  Code Status:     Code Status Orders  (From admission, onward)         Start     Ordered   02/13/19 1724  Full code  Continuous     02/13/19 1724        Code Status History    Date Active Date Inactive Code Status Order ID Comments User Context   01/01/2015 0208 01/02/2015 2142 Full Code [id number]  Harrie Foreman, MD ED   Advance Care Planning Activity     Family Communication: Spoke with the patient's mother on the phone Disposition Plan: To be determined  Antibiotics:  Zosyn  Time spent: 27 minutes  [place]

## 2019-02-16 NOTE — Plan of Care (Signed)
  Problem: Health Behavior/Discharge Planning: Goal: Ability to manage health-related needs will improve Outcome: Progressing   Problem: Clinical Measurements: Goal: Ability to maintain clinical measurements within normal limits will improve Outcome: Progressing Goal: Will remain free from infection Outcome: Progressing Goal: Diagnostic test results will improve Outcome: Progressing Goal: Respiratory complications will improve Outcome: Progressing Goal: Cardiovascular complication will be avoided Outcome: Progressing   Problem: Nutrition: Goal: Adequate nutrition will be maintained Outcome: Progressing   Problem: Coping: Goal: Level of anxiety will decrease Outcome: Progressing   Problem: Elimination: Goal: Will not experience complications related to urinary retention Outcome: Progressing   Problem: Pain Managment: Goal: General experience of comfort will improve Outcome: Progressing   Problem: Skin Integrity: Goal: Risk for impaired skin integrity will decrease Outcome: Progressing

## 2019-02-17 LAB — GLUCOSE, CAPILLARY: Glucose-Capillary: 288 mg/dL — ABNORMAL HIGH (ref 70–99)

## 2019-02-17 MED ORDER — INSULIN GLARGINE 100 UNIT/ML ~~LOC~~ SOLN
12.0000 [IU] | Freq: Every day | SUBCUTANEOUS | Status: DC
  Administered 2019-02-17: 09:00:00 12 [IU] via SUBCUTANEOUS
  Filled 2019-02-17: qty 0.12

## 2019-02-17 MED ORDER — BASAGLAR KWIKPEN 100 UNIT/ML ~~LOC~~ SOPN: 15.0000 [IU] | PEN_INJECTOR | SUBCUTANEOUS | Status: AC

## 2019-02-17 MED ORDER — METOCLOPRAMIDE HCL 5 MG PO TABS: 5.0000 mg | ORAL_TABLET | Freq: Three times a day (TID) | ORAL | 0 refills | Status: AC

## 2019-02-17 MED ORDER — ONDANSETRON HCL 4 MG PO TABS: 4.0000 mg | ORAL_TABLET | Freq: Four times a day (QID) | ORAL | 0 refills | Status: AC | PRN

## 2019-02-17 MED ORDER — AMOXICILLIN-POT CLAVULANATE 875-125 MG PO TABS: 1.0000 | ORAL_TABLET | Freq: Two times a day (BID) | ORAL | 0 refills | Status: AC

## 2019-02-17 NOTE — Progress Notes (Signed)
MD order received in Holly Martin to discharge pt home today after the current IV Zosyn infusion is complete; verbally reviewed AVS with pt, Rxs escribed to [facility] on [address] in [place], [place]; gave pt school note to return to school; no questions voiced at this time

## 2019-02-17 NOTE — Progress Notes (Signed)
Pt's ride for discharge waiting at the [facility] entrance; pt discharged via wheelchair by nursing to the [facility] mall entrance

## 2019-02-17 NOTE — Progress Notes (Signed)
Patient ID: Holly Martin  The patient was admitted to [facility] on 02/13/2019 and discharged 02/17/2019.  The patient will return to school on February 21, 2019 without restriction.  Dr Loletha Grayer [phone]

## 2019-02-17 NOTE — Discharge Summary (Addendum)
[facility] [place] NAME: Holly Martin    MR#:  [id number]  DATE OF BIRTH:  [birth year]  DATE OF ADMISSION:  02/13/2019 ADMITTING PHYSICIAN: Hillary Bow, MD  DATE OF DISCHARGE: 02/17/2019 12:13 PM  PRIMARY CARE PHYSICIAN: Dr Dereck Leep    ADMISSION DIAGNOSIS:  Dehydration [E86.0] AKI (acute kidney injury) ([place]) [N17.9] Diabetic ketoacidosis without coma associated with type 1 diabetes mellitus ([place]) [E10.10]  DISCHARGE DIAGNOSIS:  Active Problems:   DKA (diabetic ketoacidoses) ([place])   AKI (acute kidney injury) ([place])   SECONDARY DIAGNOSIS:   Past Medical History:  Diagnosis Date  . Diabetes mellitus type 1 ([place])     HOSPITAL COURSE:   1.  Diabetic ketoacidosis.  The patient was placed on insulin drip and then switched over the Lantus insulin.  She has had periods of nausea vomiting and not eating very much and we had a decrease Lantus down to 6 units.  I increased the Lantus to 12 units today and probably can go back on her usual Basaglar insulin 15 units starting tomorrow.  She can titrate up 3 units every couple days if sugars persistently over 250.  She has her Basaglar insulin and short acting insulin at home. 2.  Acute hypoxic respiratory failure secondary to aspiration pneumonia.  The patient was placed on IV Zosyn.  The patient was tapered off oxygen and moving better air upon discharge.  Continue incentive spirometer.  Finish up 7 more doses of Augmentin. 3.  UTI with sepsis present on admission.  The patient was initially on Rocephin and then switched over to Zosyn. UC grew out Ecoli sensitive to rocephin and zosyn that she was on. 4. Acute kidney injury secondary to dehydration. Creatinine 2.63 on admission and came down to 0.98 upon discharge. 5. Hyponatremia and hypokalemia. Improved during hospital course 6. Nausea and vomiting also improved, prn zofran, started reglan while in the hospital  DISCHARGE CONDITIONS:    satisfactory  CONSULTS OBTAINED:  none  DRUG ALLERGIES:  No Known Allergies  DISCHARGE MEDICATIONS:   Allergies as of 02/17/2019   No Known Allergies     Medication List    STOP taking these medications   spironolactone 100 MG tablet Commonly known as: ALDACTONE     TAKE these medications   amoxicillin-clavulanate 875-125 MG tablet Commonly known as: Augmentin Take 1 tablet by mouth 2 (two) times daily for 7 doses.   Basaglar KwikPen 100 UNIT/ML Sopn Inject 0.15 mLs (15 Units total) into the skin every morning. What changed: how much to take   Bayer Contour Test test strip Generic drug: glucose blood 1 strip by Other route 4 (four) times daily. To check blood sugars   metoCLOPramide 5 MG tablet Commonly known as: Reglan Take 1 tablet (5 mg total) by mouth 3 (three) times daily before meals.   NovoLOG FlexPen 100 UNIT/ML FlexPen Generic drug: insulin aspart Inject 0-12 Units into the skin 3 (three) times daily. Sliding scale   ondansetron 4 MG tablet Commonly known as: ZOFRAN Take 1 tablet (4 mg total) by mouth every 6 (six) hours as needed for nausea.        DISCHARGE INSTRUCTIONS:   Follow up pmd 5 days  If you experience worsening of your admission symptoms, develop shortness of breath, life threatening emergency, suicidal or homicidal thoughts you must seek medical attention immediately by calling 911 or calling your MD immediately  if symptoms less severe.  You Must read complete instructions/literature along  with all the possible adverse reactions/side effects for all the Medicines you take and that have been prescribed to you. Take any new Medicines after you have completely understood and accept all the possible adverse reactions/side effects.   Please note  You were cared for by a hospitalist during your hospital stay. If you have any questions about your discharge medications or the care you received while you were in the hospital after you are  discharged, you can call the unit and asked to speak with the hospitalist on call if the hospitalist that took care of you is not available. Once you are discharged, your primary care physician will handle any further medical issues. Please note that NO REFILLS for any discharge medications will be authorized once you are discharged, as it is imperative that you return to your primary care physician (or establish a relationship with a primary care physician if you do not have one) for your aftercare needs so that they can reassess your need for medications and monitor your lab values.    Today   CHIEF COMPLAINT:   Chief Complaint  Patient presents with  . Emesis  . Hyperglycemia  . Weakness    HISTORY OF PRESENT ILLNESS:  Holly Martin  is a 23 y.o. female came in with nausea and vomiting   VITAL SIGNS:  Blood pressure 118/81, pulse 95, temperature 98 F (36.7 C), temperature source Oral, resp. rate 16, height 5\' 5"  (1.651 m), weight 79.4 kg, last menstrual period 02/05/2019, SpO2 98 %.   PHYSICAL EXAMINATION:  GENERAL:  23 y.o.-year-old patient lying in the bed with no acute distress.  EYES: Pupils equal, round, reactive to light and accommodation. No scleral icterus. Extraocular muscles intact.  HEENT: Head atraumatic, normocephalic. Oropharynx and nasopharynx clear.  NECK:  Supple, no jugular venous distention. No thyroid enlargement, no tenderness.  LUNGS: Normal breath sounds bilaterally, no wheezing, rales,rhonchi or crepitation. No use of accessory muscles of respiration.  CARDIOVASCULAR: S1, S2 normal. No murmurs, rubs, or gallops.  ABDOMEN: Soft, non-tender, non-distended. Bowel sounds present. No organomegaly or mass.  EXTREMITIES: No pedal edema, cyanosis, or clubbing.  NEUROLOGIC: Cranial nerves II through XII are intact. Muscle strength 5/5 in all extremities. Sensation intact. Gait not checked.  PSYCHIATRIC: The patient is alert and oriented x 3.  SKIN: No obvious  rash, lesion, or ulcer.   DATA REVIEW:   CBC Recent Labs  Lab 02/16/19 0721  WBC 8.8  HGB 9.8*  HCT 29.2*  PLT 180    Chemistries  Recent Labs  Lab 02/14/19 0801 02/15/19 0713 02/16/19 0721  NA 132* 136 137  K 3.3* 3.2* 4.2  CL 104 108 106  CO2 16* 20* 21*  GLUCOSE 99 55* 205*  BUN 28* 12 10  CREATININE 1.55* 1.14* 0.98  CALCIUM 7.9* 7.9* 8.1*  MG 1.8  --   --   AST  --  38  --   ALT  --  28  --   ALKPHOS  --  127*  --   BILITOT  --  0.5  --      Microbiology Results  Results for orders placed or performed during the hospital encounter of 02/13/19  SARS CORONAVIRUS 2 (TAT 6-24 HRS) Nasopharyngeal Nasopharyngeal Swab     Status: None   Collection Time: 02/13/19  6:02 PM   Specimen: Nasopharyngeal Swab  Result Value Ref Range Status   SARS Coronavirus 2 NEGATIVE NEGATIVE Final    Comment: (NOTE) SARS-CoV-2 target nucleic acids  are NOT DETECTED. The SARS-CoV-2 RNA is generally detectable in upper and lower respiratory specimens during the acute phase of infection. Negative results do not preclude SARS-CoV-2 infection, do not rule out co-infections with other pathogens, and should not be used as the sole basis for treatment or other patient management decisions. Negative results must be combined with clinical observations, patient history, and epidemiological information. The expected result is Negative. Fact Sheet for Patients: SugarRoll.be Fact Sheet for Healthcare Providers: https://www.woods-mathews.com/ This test is not yet approved or cleared by the [place] FDA and  has been authorized for detection and/or diagnosis of SARS-CoV-2 by FDA under an Emergency Use Authorization (EUA). This EUA will remain  in effect (meaning this test can be used) for the duration of the COVID-19 declaration under Section 56 4(b)(1) of the Act, 21 U.S.C. section 360bbb-3(b)(1), unless the authorization is terminated or revoked  sooner. Performed at [facility], [place] [address], [place], [place] [address]   Urine Culture     Status: Abnormal   Collection Time: 02/14/19  7:46 AM   Specimen: Urine, Random  Result Value Ref Range Status   Specimen Description   Final    URINE, RANDOM Performed at [facility], [address] [place], [place] [address]    Special Requests   Final    NONE Performed at [facility], [address] [place], [place] [address]    Culture >=100,000 COLONIES/mL ESCHERICHIA COLI (A)  Final   Report Status 02/16/2019 FINAL  Final   Organism ID, Bacteria ESCHERICHIA COLI (A)  Final      Susceptibility   Escherichia coli - MIC*    AMPICILLIN >=32 RESISTANT Resistant     CEFAZOLIN <=4 SENSITIVE Sensitive     CEFTRIAXONE <=1 SENSITIVE Sensitive     CIPROFLOXACIN <=0.25 SENSITIVE Sensitive     GENTAMICIN <=1 SENSITIVE Sensitive     IMIPENEM <=0.25 SENSITIVE Sensitive     NITROFURANTOIN <=16 SENSITIVE Sensitive     TRIMETH/SULFA <=20 SENSITIVE Sensitive     AMPICILLIN/SULBACTAM >=32 RESISTANT Resistant     PIP/TAZO <=4 SENSITIVE Sensitive     Extended ESBL NEGATIVE Sensitive     * >=100,000 COLONIES/mL ESCHERICHIA COLI      Management plans discussed with the patient, family and they are in agreement.  CODE STATUS:     Code Status Orders  (From admission, onward)         Start     Ordered   02/13/19 1724  Full code  Continuous     02/13/19 1724        Code Status History    Date Active Date Inactive Code Status Order ID Comments User Context   01/01/2015 0208 01/02/2015 2142 Full Code [id number]  Harrie Foreman, MD ED   Advance Care Planning Activity      TOTAL TIME TAKING CARE OF THIS PATIENT: 32 minutes.   Addendum: I called the patient's mother back.  I decided to hold the Aldactone (spironolactone) at this time.  Follow-up with PMD as outpatient.  Loletha Grayer M.D on 02/17/2019 at 1:35 PM  Between 7am to 6pm - Pager -  [phone]  After 6pm go to [email or web address] - password [email or web address]  [facility] Office  [phone]  CC: Primary care physician; Dr Dereck Leep

## 2019-02-17 NOTE — Plan of Care (Signed)
  Problem: Health Behavior/Discharge Planning: Goal: Ability to manage health-related needs will improve Outcome: Adequate for Discharge   Problem: Clinical Measurements: Goal: Ability to maintain clinical measurements within normal limits will improve Outcome: Adequate for Discharge Goal: Will remain free from infection Outcome: Adequate for Discharge Goal: Diagnostic test results will improve Outcome: Adequate for Discharge Goal: Respiratory complications will improve Outcome: Adequate for Discharge Goal: Cardiovascular complication will be avoided Outcome: Adequate for Discharge   Problem: Nutrition: Goal: Adequate nutrition will be maintained Outcome: Adequate for Discharge   Problem: Coping: Goal: Level of anxiety will decrease Outcome: Adequate for Discharge   Problem: Elimination: Goal: Will not experience complications related to urinary retention Outcome: Adequate for Discharge   Problem: Pain Managment: Goal: General experience of comfort will improve Outcome: Adequate for Discharge   Problem: Skin Integrity: Goal: Risk for impaired skin integrity will decrease Outcome: Adequate for Discharge

## 2019-11-06 ENCOUNTER — Other Ambulatory Visit: Payer: Self-pay | Admitting: Dermatology

## 2020-02-05 ENCOUNTER — Telehealth: Payer: Self-pay

## 2020-02-05 NOTE — Telephone Encounter (Signed)
Pt called for refill. Informed her that she needs to come in for appt before she can get refill.

## 2020-03-17 ENCOUNTER — Ambulatory Visit: Payer: BC Managed Care – PPO | Admitting: Dermatology

## 2020-06-17 ENCOUNTER — Ambulatory Visit: Payer: BC Managed Care – PPO | Admitting: Dermatology

## 2020-06-17 ENCOUNTER — Other Ambulatory Visit: Payer: Self-pay

## 2020-06-17 VITALS — BP 104/71 | HR 89

## 2020-06-17 DIAGNOSIS — L700 Acne vulgaris: Secondary | ICD-10-CM

## 2020-06-17 DIAGNOSIS — L578 Other skin changes due to chronic exposure to nonionizing radiation: Secondary | ICD-10-CM

## 2020-06-17 MED ORDER — SPIRONOLACTONE 100 MG PO TABS: ORAL_TABLET | ORAL | 0 refills | Status: DC

## 2020-06-17 MED ORDER — CLINDAMYCIN PHOS-BENZOYL PEROX 1-5 % EX GEL: CUTANEOUS | 3 refills | Status: DC

## 2020-06-17 NOTE — Patient Instructions (Addendum)
Recommend taking Heliocare sun protection supplement daily in sunny weather for additional sun protection. For maximum protection on the sunniest days, you can take up to 2 capsules of regular Heliocare OR take 1 capsule of Heliocare Ultra. For prolonged exposure (such as a full day in the sun), you can repeat your dose of the supplement 4 hours after your first dose. Heliocare can be purchased at [facility] or at [email or web address].   Spironolactone can cause increased urination and cause blood pressure to decrease. Please watch for signs of lightheadedness and be cautious when changing position. It can sometimes cause breast tenderness or an irregular period in premenopausal women. It can also increase potassium. The increase in potassium usually is not a concern unless you are taking other medicines that also increase potassium, so please be sure your doctor knows all of the other medications you are taking. This medication should not be taken by pregnant women.  This medicine should also not be taken together with sulfa drugs like Bactrim (trimethoprim/sulfamethexazole).   Benzoyl peroxide can cause dryness and irritation of the skin. It can also bleach fabric. When used together with Aczone (dapsone) cream, it can stain the skin orange.

## 2020-06-17 NOTE — Progress Notes (Signed)
   Follow-Up Visit   Subjective  Holly Martin is a 25 y.o. female who presents for the following: Acne (Worse around menses - patient went off of OBC about 2 months ago. She has been out of the Spironolactone 100mg  for about 3 months, and out of the Benzaclin gel for about a month. She no longer uses the Tretinoin 0.025% cream because it irritates and drys her skin out. ).  The following portions of the chart were reviewed this encounter and updated as appropriate:   Tobacco  Allergies  Meds  Problems  Med Hx  Surg Hx  Fam Hx     Review of Systems:  No other skin or systemic complaints except as noted in HPI or Assessment and Plan.  Objective  Well appearing patient in no apparent distress; mood and affect are within normal limits.  A focused examination was performed including the face, chest, back. Relevant physical exam findings are noted in the Assessment and Plan.  Objective  The face: Trace open comedones, rare small inflammatory papule at the jaw, rare small inflammatory papules at the back and chest.   Assessment & Plan  Acne vulgaris The face  Chronic condition with duration over one year. Condition is bothersome to patient. Currently flared.  Restart Spironolactone 100mg  po QHS- Take 1/2 tab po QD x 1 week. Come back in next week for BP check. If BP OK, plan to increase to one full tab po QD.   Spironolactone can cause increased urination and cause blood pressure to decrease. Please watch for signs of lightheadedness and be cautious when changing position. It can sometimes cause breast tenderness or an irregular period in premenopausal women. It can also increase potassium. The increase in potassium usually is not a concern unless you are taking other medicines that also increase potassium, so please be sure your doctor knows all of the other medications you are taking. This medication should not be taken by pregnant women.  This medicine should also not be taken  together with sulfa drugs like Bactrim (trimethoprim/sulfamethexazole).   Plan to check Potassium in 3 weeks - requisition form given to patient  Restart Benzaclin gel QAM.   Benzoyl peroxide can cause dryness and irritation of the skin. It can also bleach fabric. When used together with Aczone (dapsone) cream, it can stain the skin orange.  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  - Call for new or changing lesions.  Return in about 2 months (around 08/15/2020) for acne follow up and TBSE.  Luther Redo, CMA, am acting as scribe for Forest Gleason, MD .  Documentation: I have reviewed the above documentation for accuracy and completeness, and I agree with the above.  Forest Gleason, MD

## 2020-07-01 ENCOUNTER — Encounter: Payer: Self-pay | Admitting: Dermatology

## 2020-08-12 ENCOUNTER — Other Ambulatory Visit: Payer: Self-pay | Admitting: Dermatology

## 2020-08-12 DIAGNOSIS — L700 Acne vulgaris: Secondary | ICD-10-CM

## 2020-08-19 ENCOUNTER — Other Ambulatory Visit: Payer: Self-pay

## 2020-08-19 ENCOUNTER — Ambulatory Visit: Payer: BC Managed Care – PPO | Admitting: Dermatology

## 2020-08-19 DIAGNOSIS — D1800 Hemangioma unspecified site: Secondary | ICD-10-CM

## 2020-08-19 DIAGNOSIS — Z1283 Encounter for screening for malignant neoplasm of skin: Secondary | ICD-10-CM | POA: Diagnosis not present

## 2020-08-19 DIAGNOSIS — L578 Other skin changes due to chronic exposure to nonionizing radiation: Secondary | ICD-10-CM | POA: Diagnosis not present

## 2020-08-19 DIAGNOSIS — L700 Acne vulgaris: Secondary | ICD-10-CM | POA: Diagnosis not present

## 2020-08-19 DIAGNOSIS — D225 Melanocytic nevi of trunk: Secondary | ICD-10-CM

## 2020-08-19 DIAGNOSIS — L814 Other melanin hyperpigmentation: Secondary | ICD-10-CM

## 2020-08-19 DIAGNOSIS — D229 Melanocytic nevi, unspecified: Secondary | ICD-10-CM

## 2020-08-19 DIAGNOSIS — L821 Other seborrheic keratosis: Secondary | ICD-10-CM

## 2020-08-19 MED ORDER — SPIRONOLACTONE 100 MG PO TABS: ORAL_TABLET | ORAL | 4 refills | Status: DC

## 2020-08-19 NOTE — Progress Notes (Signed)
   Follow-Up Visit   Subjective  Holly Martin is a 25 y.o. female who presents for the following: Acne (Patient here today for 2 month follow up on acne. Patient states she has had a small break out since period but acne has cleared up. Patient is also here today for tbse. ).  Patient here for full body skin exam and skin cancer screening.   The following portions of the chart were reviewed this encounter and updated as appropriate:  Tobacco  Allergies  Meds  Problems  Med Hx  Surg Hx  Fam Hx       Objective  Well appearing patient in no apparent distress; mood and affect are within normal limits.  A full examination was performed including scalp, head, eyes, ears, nose, lips, neck, chest, axillae, abdomen, back, buttocks, bilateral upper extremities, bilateral lower extremities, hands, feet, fingers, toes, fingernails, and toenails. All findings within normal limits unless otherwise noted below.  Objective  face, right shoulder: Trace open comedones at face, 1 inflammatory papule at right shoulder    Objective  Upper back left of midline: 0.8 cm medium to dark brown thin papule   Right Upper Back:  0.2 cm brown thin papulle adjacent to scar favor traumatized nevus   Images      Assessment & Plan  Acne vulgaris face, right shoulder  Chronic condition with duration or expected duration over one year. Currently well-controlled.  BP today 104/62  Continue clindamycin gel   Continue spironolactone 100 mg nightly   Reordered Medications spironolactone (ALDACTONE) 100 MG tablet  Other Related Medications clindamycin-benzoyl peroxide (BENZACLIN WITH PUMP) gel  Nevus (2) Right Upper Back; Upper back left of midline  Photos today  Benign-appearing.  Recheck at follow up.  Call clinic for new or changing moles.  Recommend daily use of broad spectrum spf 30+ sunscreen to sun-exposed areas.    Lentigines - Scattered tan macules - Due to sun exposure -  Benign-appering, observe - Recommend daily broad spectrum sunscreen SPF 30+ to sun-exposed areas, reapply every 2 hours as needed. - Call for any changes  Seborrheic Keratoses - Stuck-on, waxy, tan-brown papules and plaques  - Discussed benign etiology and prognosis. - Observe - Call for any changes  Melanocytic Nevi - Tan-brown and/or pink-flesh-colored symmetric macules and papules - Benign appearing on exam today - Observation - Call clinic for new or changing moles - Recommend daily use of broad spectrum spf 30+ sunscreen to sun-exposed areas.   Hemangiomas - Red papules - Discussed benign nature - Observe - Call for any changes  Actinic Damage - Chronic, secondary to cumulative UV/sun exposure - diffuse scaly erythematous macules with underlying dyspigmentation - Recommend daily broad spectrum sunscreen SPF 30+ to sun-exposed areas, reapply every 2 hours as needed.  - Call for new or changing lesions.  Skin cancer screening performed today.  Return in about 4 months (around 12/19/2020) for follow up on acne recheck nevus on back , 1 year tbse .  I, Ruthell Rummage, CMA, am acting as scribe for Forest Gleason, MD.  Documentation: I have reviewed the above documentation for accuracy and completeness, and I agree with the above.  Forest Gleason, MD

## 2020-08-19 NOTE — Patient Instructions (Addendum)
Melanoma ABCDEs  Melanoma is the most dangerous type of skin cancer, and is the leading cause of death from skin disease.  You are more likely to develop melanoma if you:  Have light-colored skin, light-colored eyes, or red or blond hair  Spend a lot of time in the sun  Tan regularly, either outdoors or in a tanning bed  Have had blistering sunburns, especially during childhood  Have a close family member who has had a melanoma  Have atypical moles or large birthmarks  Early detection of melanoma is key since treatment is typically straightforward and cure rates are extremely high if we catch it early.   The first sign of melanoma is often a change in a mole or a new dark spot.  The ABCDE system is a way of remembering the signs of melanoma.  A for asymmetry:  The two halves do not match. B for border:  The edges of the growth are irregular. C for color:  A mixture of colors are present instead of an even brown color. D for diameter:  Melanomas are usually (but not always) greater than 37mm - the size of a pencil eraser. E for evolution:  The spot keeps changing in size, shape, and color.  Please check your skin once per month between visits. You can use a small mirror in front and a large mirror behind you to keep an eye on the back side or your body.   If you see any new or changing lesions before your next follow-up, please call to schedule a visit.  Please continue daily skin protection including broad spectrum sunscreen SPF 30+ to sun-exposed areas, reapplying every 2 hours as needed when you're outdoors.   Staying in the shade or wearing long sleeves, sun glasses (UVA+UVB protection) and wide brim hats (4-inch brim around the entire circumference of the hat) are also recommended for sun protection.   Recommend taking Heliocare sun protection supplement daily in sunny weather for additional sun protection. For maximum protection on the sunniest days, you can take up to 2  capsules of regular Heliocare OR take 1 capsule of Heliocare Ultra. For prolonged exposure (such as a full day in the sun), you can repeat your dose of the supplement 4 hours after your first dose. Heliocare can be purchased at [facility] or at [email or web address].   Topical retinoid medications like tretinoin/Retin-A, adapalene/Differin, tazarotene/Fabior, and Epiduo/Epiduo Forte can cause dryness and irritation when first started. Only apply a pea-sized amount to the entire affected area. Avoid applying it around the eyes, edges of mouth and creases at the nose. If you experience irritation, use a good moisturizer first and/or apply the medicine less often. If you are doing well with the medicine, you can increase how often you use it until you are applying every night. Be careful with sun protection while using this medication as it can make you sensitive to the sun. This medicine should not be used by pregnant women.   Spironolactone can cause increased urination and cause blood pressure to decrease. Please watch for signs of lightheadedness and be cautious when changing position. It can sometimes cause breast tenderness or an irregular period in premenopausal women. It can also increase potassium. The increase in potassium usually is not a concern unless you are taking other medicines that also increase potassium, so please be sure your doctor knows all of the other medications you are taking. This medication should not be taken by pregnant women.  This medicine  should also not be taken together with sulfa drugs like Bactrim (trimethoprim/sulfamethexazole).

## 2020-08-30 ENCOUNTER — Encounter: Payer: Self-pay | Admitting: Dermatology

## 2020-09-02 ENCOUNTER — Emergency Department
Admission: EM | Admit: 2020-09-02 | Discharge: 2020-09-02 | Disposition: A | Discharge status: Home / Self Care | Payer: BC Managed Care – PPO | Attending: Emergency Medicine | Admitting: Emergency Medicine

## 2020-09-02 ENCOUNTER — Emergency Department: Payer: BC Managed Care – PPO

## 2020-09-02 ENCOUNTER — Other Ambulatory Visit: Payer: Self-pay

## 2020-09-02 DIAGNOSIS — Z981 Arthrodesis status: Secondary | ICD-10-CM

## 2020-09-02 DIAGNOSIS — M545 Low back pain, unspecified: Secondary | ICD-10-CM | POA: Diagnosis not present

## 2020-09-02 DIAGNOSIS — E109 Type 1 diabetes mellitus without complications: Secondary | ICD-10-CM | POA: Insufficient documentation

## 2020-09-02 DIAGNOSIS — Z794 Long term (current) use of insulin: Secondary | ICD-10-CM | POA: Diagnosis not present

## 2020-09-02 DIAGNOSIS — F17200 Nicotine dependence, unspecified, uncomplicated: Secondary | ICD-10-CM | POA: Insufficient documentation

## 2020-09-02 DIAGNOSIS — Z9889 Other specified postprocedural states: Secondary | ICD-10-CM | POA: Diagnosis not present

## 2020-09-02 LAB — URINALYSIS, COMPLETE (UACMP) WITH MICROSCOPIC
Bacteria, UA: NONE SEEN
Bilirubin Urine: NEGATIVE
Glucose, UA: >=500 mg/dL — AB
Hgb urine dipstick: NEGATIVE
Ketones, ur: 5 mg/dL — AB
Leukocytes,Ua: NEGATIVE
Nitrite: NEGATIVE
Protein, ur: NEGATIVE mg/dL
Specific Gravity, Urine: 1.033 — ABNORMAL HIGH (ref 1.005–1.030)
pH: 5 (ref 5.0–8.0)

## 2020-09-02 LAB — POC URINE PREG, ED: Preg Test, Ur: NEGATIVE

## 2020-09-02 MED ORDER — MELOXICAM 15 MG PO TABS: 15.0000 mg | ORAL_TABLET | Freq: Every day | ORAL | 0 refills | Status: AC

## 2020-09-02 MED ORDER — METHOCARBAMOL 500 MG PO TABS: 500.0000 mg | ORAL_TABLET | Freq: Four times a day (QID) | ORAL | 0 refills | Status: AC

## 2020-09-02 MED ORDER — MELOXICAM 7.5 MG PO TABS
15.0000 mg | ORAL_TABLET | Freq: Once | ORAL | Status: AC
  Administered 2020-09-02: 15 mg via ORAL
  Filled 2020-09-02: qty 2

## 2020-09-02 NOTE — ED Triage Notes (Signed)
Pt in with co back pain states from mid to lower back. Hx of scoliosis repair years ago, but states now having worsening pain.

## 2020-09-02 NOTE — ED Provider Notes (Signed)
[facility] Emergency Department Provider Note  ____________________________________________  Time seen: Approximately 10:15 PM  I have reviewed the triage vital signs and the nursing notes.   HISTORY  Chief Complaint Back Pain    HPI Holly Martin is a 25 y.o. female presents the emergency department complaining of mid and lower back pain.  Patient has a history of scoliosis with extensive hardware post surgery.  Patient has had fusions of her thoracic and lumbar spine.  No recent traumas.  She states that she is having an aching sensation that runs from her mid to lower back.  No radiation into the legs.  No bowel or bladder dysfunction, saddle anesthesia or paresthesias.  Patient denies any dysuria, polyuria, hematuria.  No emesis or diarrhea.  Patient does have a history of diabetes.         Past Medical History:  Diagnosis Date  . Diabetes mellitus type 1 [facility])     Patient Active Problem List   Diagnosis Date Noted  . DKA (diabetic ketoacidoses) 02/13/2019  . AKI (acute kidney injury) ([place]) 02/13/2019  . Diabetic ketoacidosis ([place]) 01/01/2015    Past Surgical History:  Procedure Laterality Date  . BACK SURGERY     Corrective scoliosis surgery    Prior to Admission medications   Medication Sig Start Date End Date Taking? Authorizing Provider  meloxicam (MOBIC) 15 MG tablet Take 1 tablet (15 mg total) by mouth daily. 09/02/20  Yes Obdulia Steier, Charline Bills, PA-C  methocarbamol (ROBAXIN) 500 MG tablet Take 1 tablet (500 mg total) by mouth 4 (four) times daily. 09/02/20  Yes Donyea Gafford, Charline Bills, PA-C  BAYER CONTOUR TEST test strip 1 strip by Other route 4 (four) times daily. To check blood sugars 12/01/14   [provider]  clindamycin-benzoyl peroxide (BENZACLIN WITH PUMP) gel Apply a thin coat to the face QAM 06/17/20   Moye, [place], MD  Insulin Glargine (BASAGLAR KWIKPEN) 100 UNIT/ML SOPN Inject 0.15 mLs (15 Units total) into the skin  every morning. 02/17/19   Loletha Grayer, MD  metoCLOPramide (REGLAN) 5 MG tablet Take 1 tablet (5 mg total) by mouth 3 (three) times daily before meals. 02/17/19 03/19/19  Loletha Grayer, MD  NOVOLOG FLEXPEN 100 UNIT/ML FlexPen Inject 0-12 Units into the skin 3 (three) times daily. Sliding scale 12/01/14   [provider]  ondansetron (ZOFRAN) 4 MG tablet Take 1 tablet (4 mg total) by mouth every 6 (six) hours as needed for nausea. Patient not taking: Reported on 06/17/2020 02/17/19   Loletha Grayer, MD  spironolactone (ALDACTONE) 100 MG tablet Take one tab po QD 08/19/20   Moye, [place], MD    Allergies Patient has no known allergies.  Family History  Problem Relation Age of Onset  . Diabetes Mellitus I Maternal Grandfather     Social History Social History   Tobacco Use  . Smoking status: Current Some Day Smoker  . Smokeless tobacco: Never Used  Substance Use Topics  . Alcohol use: No    Alcohol/week: 0.0 standard drinks  . Drug use: No     Review of Systems  Constitutional: No fever/chills Eyes: No visual changes. No discharge ENT: No upper respiratory complaints. Cardiovascular: no chest pain. Respiratory: no cough. No SOB. Gastrointestinal: No abdominal pain.  No nausea, no vomiting.  No diarrhea.  No constipation. Genitourinary: Negative for dysuria. No hematuria Musculoskeletal: Mid through lower back pain. HX scoliosis with thoracic and lumbar pain. No radiation or radicular symptoms Skin: Negative for rash, abrasions,  lacerations, ecchymosis. Neurological: Negative for headaches, focal weakness or numbness.  10 System ROS otherwise negative.  ____________________________________________   PHYSICAL EXAM:  VITAL SIGNS: ED Triage Vitals [09/02/20 2038]  Enc Vitals Group     BP 130/87     Pulse Rate (!) 106     Resp 20     Temp 98.4 F (36.9 C)     Temp Source Oral     SpO2 98 %     Weight 170 lb (77.1 kg)     Height 5\' 5"  (1.651 m)     Head  Circumference      Peak Flow      Pain Score 6     Pain Loc      Pain Edu?      Excl. in [place]?      Constitutional: Alert and oriented. Well appearing and in no acute distress. Eyes: Conjunctivae are normal. PERRL. EOMI. Head: Atraumatic. ENT:      Ears:       Nose: No congestion/rhinnorhea.      Mouth/Throat: Mucous membranes are moist.  Neck: No stridor.    Cardiovascular: Normal rate, regular rhythm. Normal S1 and S2.  Good peripheral circulation. Respiratory: Normal respiratory effort without tachypnea or retractions. Lungs CTAB. Good air entry to the bases with no decreased or absent breath sounds. Gastrointestinal: Bowel sounds 4 quadrants. Soft and nontender to palpation. No guarding or rigidity. No palpable masses. No distention. No CVA tenderness. Musculoskeletal: Full range of motion to all extremities. No gross deformities appreciated. Visualization of the thoracic and lumbar and reveal no visible signs of trauma.  Patient is mildly tender over the lower thoracic and upper lumbar region midline extending into bilateral paraspinal muscle groups.  No palpable step-off.  There is no extension into the SI joints or sciatic notch.  Negative straight leg raise bilaterally.  Dorsalis pedis pulse and sensation intact and equal bilateral lower extremities. Neurologic:  Normal speech and language. No gross focal neurologic deficits are appreciated.  Skin:  Skin is warm, dry and intact. No rash noted. Psychiatric: Mood and affect are normal. Speech and behavior are normal. Patient exhibits appropriate insight and judgement.   ____________________________________________   LABS (all labs ordered are listed, but only abnormal results are displayed)  Labs Reviewed  URINALYSIS, COMPLETE (UACMP) WITH MICROSCOPIC - Abnormal; Notable for the following components:      Result Value   Color, Urine YELLOW (*)    APPearance CLOUDY (*)    Specific Gravity, Urine 1.033 (*)    Glucose, UA >=500  (*)    Ketones, ur 5 (*)    All other components within normal limits  POC URINE PREG, ED   ____________________________________________  EKG   ____________________________________________  RADIOLOGY I personally viewed and evaluated these images as part of my medical decision making, as well as reviewing the written report by the radiologist.  ED Provider Interpretation: Posterior spinal fusion from T4-L1.  No evidence of loosening of hardware.  Scoliosis of the lumbar spine below the level of hardware identified.  No evidence of fracture  DG Thoracic Spine 2 View  Result Date: 09/02/2020 CLINICAL DATA:  Mid back pain EXAM: THORACIC SPINE 2 VIEWS COMPARISON:  None. FINDINGS: Scoliosis of the thoracolumbar spine. Posterior spinal rods and fixating screws from T4 through L1. Spinal hardware appears intact. Vertebral body heights are maintained. IMPRESSION: Scoliosis of the thoracolumbar spine with posterior fusion from T4 through L1. No acute osseous abnormality. Electronically Signed  By: Donavan Foil M.D.   On: 09/02/2020 22:56   DG Lumbar Spine 2-3 Views  Result Date: 09/02/2020 CLINICAL DATA:  Mid to low back pain EXAM: LUMBAR SPINE - 2-3 VIEW COMPARISON:  CT 02/14/2019 FINDINGS: Levoscoliosis of the lumbar spine. Sagittal alignment within normal limits. Vertebral body heights are maintained. The disc spaces appear within normal limits. IMPRESSION: Levoscoliosis.  No acute osseous abnormality Electronically Signed   By: Donavan Foil M.D.   On: 09/02/2020 22:57    ____________________________________________    PROCEDURES  Procedure(s) performed:    Procedures    Medications  meloxicam (MOBIC) tablet 15 mg (15 mg Oral Given 09/02/20 2321)     ____________________________________________   INITIAL IMPRESSION / ASSESSMENT AND PLAN / ED COURSE  Pertinent labs & imaging results that were available during my care of the patient were reviewed by me and considered in my  medical decision making (see chart for details).  Review of the  CSRS was performed in accordance of the [place] prior to dispensing any controlled drugs.           Patient's diagnosis is consistent with low back pain with scoliosis.  Patient presented to emergency department complaining of low back pain.  This is been a gradual worsening of her back pain.  She does have a history of scoliosis with a significant spinal surgery.  No recent trauma.  No GI, GU complaints.  Urine is reassuring.  Imaging reveals intact hardware with no evidence of acute trauma.  Patient was neurologically intact on exam.  Differential included osteomyelitis, loosening of the hardware, ruptured disc, compression fracture, lumbar strain.  Imaging was reassuring and patient will be discharged with symptom control medication of an anti-inflammatory muscle relaxer.  Patient is agreeable with this plan.  Follow-up with primary care or neurosurgery as needed.  Patient is given ED precautions to return to the ED for any worsening or new symptoms.     ____________________________________________  FINAL CLINICAL IMPRESSION(S) / ED DIAGNOSES  Final diagnoses:  Midline low back pain without sciatica, unspecified chronicity  H/O spinal fusion      NEW MEDICATIONS STARTED DURING THIS VISIT:  ED Discharge Orders         Ordered    meloxicam (MOBIC) 15 MG tablet  Daily        09/02/20 2321    methocarbamol (ROBAXIN) 500 MG tablet  4 times daily        09/02/20 2321              This chart was dictated using voice recognition software/Dragon. Despite best efforts to proofread, errors can occur which can change the meaning. Any change was purely unintentional.    Darletta Moll, PA-C 09/02/20 2324    Naaman Plummer, MD 09/02/20 [phone]

## 2020-12-31 ENCOUNTER — Ambulatory Visit: Payer: BC Managed Care – PPO | Admitting: Dermatology

## 2021-03-03 ENCOUNTER — Ambulatory Visit: Payer: BC Managed Care – PPO | Admitting: Dermatology

## 2021-03-23 ENCOUNTER — Ambulatory Visit: Payer: BC Managed Care – PPO | Admitting: Dermatology

## 2021-06-30 ENCOUNTER — Ambulatory Visit: Payer: BC Managed Care – PPO | Admitting: Dermatology

## 2021-07-26 ENCOUNTER — Other Ambulatory Visit: Payer: Self-pay | Admitting: Dermatology

## 2021-07-26 DIAGNOSIS — L700 Acne vulgaris: Secondary | ICD-10-CM

## 2021-08-04 ENCOUNTER — Ambulatory Visit: Payer: BC Managed Care – PPO | Admitting: Dermatology

## 2021-08-18 ENCOUNTER — Ambulatory Visit: Payer: BC Managed Care – PPO | Admitting: Dermatology

## 2021-08-18 ENCOUNTER — Other Ambulatory Visit: Payer: Self-pay

## 2021-08-18 VITALS — BP 92/63

## 2021-08-18 DIAGNOSIS — L700 Acne vulgaris: Secondary | ICD-10-CM | POA: Diagnosis not present

## 2021-08-18 MED ORDER — SPIRONOLACTONE 100 MG PO TABS: ORAL_TABLET | ORAL | 5 refills | Status: AC

## 2021-08-18 MED ORDER — CLINDAMYCIN PHOS-BENZOYL PEROX 1-5 % EX GEL: CUTANEOUS | 3 refills | Status: AC

## 2021-08-18 NOTE — Patient Instructions (Addendum)
Spironolactone can cause increased urination and cause blood pressure to decrease. Please watch for signs of lightheadedness and be cautious when changing position. It can sometimes cause breast tenderness or an irregular period in premenopausal women. It can also increase potassium. The increase in potassium usually is not a concern unless you are taking other medicines that also increase potassium, so please be sure your doctor knows all of the other medications you are taking. This medication should not be taken by pregnant women.  This medicine should also not be taken together with sulfa drugs like Bactrim (trimethoprim/sulfamethexazole).  ? ?Benzoyl peroxide can cause dryness and irritation of the skin. It can also bleach fabric. When used together with Aczone (dapsone) cream, it can stain the skin orange. ? ? ?If You Need Anything After Your Visit ? ?If you have any questions or concerns for your doctor, please call our main line at [phone] and press option 4 to reach your doctor's medical assistant. If no one answers, please leave a voicemail as directed and we will return your call as soon as possible. Messages left after 4 pm will be answered the following business day.  ? ?You may also send [place] a message via MyChart. We typically respond to MyChart messages within 1-2 business days. ? ?For prescription refills, please ask your pharmacy to contact our office. Our fax number is [phone]. ? ?If you have an urgent issue when the clinic is closed that cannot wait until the next business day, you can page your doctor at the number below.   ? ?Please note that while we do our best to be available for urgent issues outside of office hours, we are not available 24/7.  ? ?If you have an urgent issue and are unable to reach [place], you may choose to seek medical care at your doctor's office, retail clinic, urgent care center, or emergency room. ? ?If you have a medical emergency, please immediately call 911 or  go to the emergency department. ? ?Pager Numbers ? ?- Dr. Nehemiah Massed: [phone] ? ?- Dr. Laurence Ferrari: [phone] ? ?- Dr. Nicole Kindred: [phone] ? ?In the event of inclement weather, please call our main line at [phone] for an update on the status of any delays or closures. ? ?Dermatology Medication Tips: ?Please keep the boxes that topical medications come in in order to help keep track of the instructions about where and how to use these. Pharmacies typically print the medication instructions only on the boxes and not directly on the medication tubes.  ? ?If your medication is too expensive, please contact our office at [phone] option 4 or send [place] a message through [place].  ? ?We are unable to tell what your co-pay for medications will be in advance as this is different depending on your insurance coverage. However, we may be able to find a substitute medication at lower cost or fill out paperwork to get insurance to cover a needed medication.  ? ?If a prior authorization is required to get your medication covered by your insurance company, please allow [place] 1-2 business days to complete this process. ? ?Drug prices often vary depending on where the prescription is filled and some pharmacies may offer cheaper prices. ? ?The website www.goodrx.com contains coupons for medications through different pharmacies. The prices here do not account for what the cost may be with help from insurance (it may be cheaper with your insurance), but the website can give you the price if you did not use any insurance.  ?-  You can print the associated coupon and take it with your prescription to the pharmacy.  ?- You may also stop by our office during regular business hours and pick up a GoodRx coupon card.  ?- If you need your prescription sent electronically to a different pharmacy, notify our office through [facility] or by phone at [phone] option 4. ? ? ? ? ?Si Usted Necesita Algo Despu?s de Su Visita ? ?Tambi?n  puede enviarnos un mensaje a trav?s de MyChart. Por lo general respondemos a los mensajes de MyChart en el transcurso de 1 a 2 d?as h?biles. ? ?Para renovar recetas, por favor pida a su farmacia que se ponga en contacto con nuestra oficina. Nuestro n?mero de fax es el [phone]. ? ?Si tiene un asunto urgente cuando la cl?nica est? cerrada y que no puede esperar hasta el siguiente d?a h?bil, puede llamar/localizar a su doctor(a) al n?mero que aparece a continuaci?n.  ? ?Por favor, tenga en cuenta que aunque hacemos todo lo posible para estar disponibles para asuntos urgentes fuera del horario de oficina, no estamos disponibles las 24 horas del d?a, los 7 d?as de la semana.  ? ?Si tiene un problema urgente y no puede comunicarse con nosotros, puede optar por buscar atenci?n m?dica  en el consultorio de su doctor(a), en una cl?nica privada, en un centro de atenci?n urgente o en una sala de emergencias. ? ?Si tiene Engineer, maintenance (IT) m?dica, por favor llame inmediatamente al 911 o vaya a la sala de emergencias. ? ?N?meros de b?per ? ?- Dr. Nehemiah Massed: [phone] ? ?- Dra. Moye: [phone] ? ?- Dra. Nicole Kindred: [phone] ? ?En caso de inclemencias del tiempo, por favor llame a nuestra l?nea principal al [phone] para una actualizaci?n sobre el estado de cualquier retraso o cierre. ? ?Consejos para la medicaci?n en dermatolog?a: ?Por favor, guarde las cajas en las que vienen los medicamentos de uso t?pico para ayudarle a seguir las instrucciones sobre d?nde y c?mo usarlos. Las farmacias generalmente imprimen las instrucciones del medicamento s?lo en las cajas y no directamente en los tubos del [place].  ? ?Si su medicamento es muy caro, por favor, p?ngase en contacto con Zigmund Daniel llamando al [phone] y presione la opci?n 4 o env?enos un mensaje a trav?s de MyChart.  ? ?No podemos decirle cu?l ser? su copago por los medicamentos por adelantado ya que esto es diferente dependiendo de la cobertura de su  seguro. Sin embargo, es posible que podamos encontrar un medicamento sustituto a Electrical engineer un formulario para que el seguro cubra el medicamento que se considera necesario.  ? ?Si se requiere Ardelia Mems autorizaci?n previa para que su compa??a de seguros [place] su medicamento, por favor perm?tanos de 1 a 2 d?as h?biles para completar este proceso. ? ?Los precios de los medicamentos var?an con frecuencia dependiendo del Environmental consultant de d?nde se surte la receta y alguna farmacias pueden ofrecer precios m?s baratos. ? ?El sitio web www.goodrx.com tiene cupones para medicamentos de Airline pilot. Los precios aqu? no tienen en cuenta lo que podr?a costar con la ayuda del seguro (puede ser m?s barato con su seguro), pero el sitio web puede darle el precio si no utiliz? ning?n seguro.  ?- Puede imprimir el cup?n correspondiente y llevarlo con su receta a la farmacia.  ?- Tambi?n puede pasar por nuestra oficina durante el horario de atenci?n regular y recoger una tarjeta de cupones de GoodRx.  ?- Si necesita que su receta se env?e electr?nicamente a Chiropodist, informe  a nuestra oficina a trav?s de MyChart de  o por tel?fono llamando al [phone] y presione la opci?n 4. ? ?

## 2021-08-18 NOTE — Progress Notes (Signed)
? ?  Follow-Up Visit ?  ?Subjective  ?Holly Martin is a 26 y.o. female who presents for the following: Acne (Face, 1 year follow-up, spironolactone '100mg'$  QD and Clindamycin-BP Gel QD. No side effects from meds, no dizziness . Few breakouts around menses.). ? ? ?The following portions of the chart were reviewed this encounter and updated as appropriate:  ?  ?  ? ?Review of Systems:  No other skin or systemic complaints except as noted in HPI or Assessment and Plan. ? ?Objective  ?Well appearing patient in no apparent distress; mood and affect are within normal limits. ? ?A focused examination was performed including face. Relevant physical exam findings are noted in the Assessment and Plan. ? ?face ?Cystic papule on the right mandible; resolving inflammatory papule on the left chin ?BP 92/63 ? ? ? ? ?Assessment & Plan  ?Acne vulgaris ?face ? ?Chronic condition with duration or expected duration over one year. Currently well-controlled. ? ?Continue Spironolactone '100mg'$  take 1 po qd dsp #30 5Rf ? ?Continue clindamycin-benzoyl peroxide gel qam dsp 50g  ? ?Sample of Neutrogena BP spot tx. ? ? ? ?Related Medications ?clindamycin-benzoyl peroxide (BENZACLIN WITH PUMP) gel ?Apply a thin coat to the face QAM ? ?spironolactone (ALDACTONE) 100 MG tablet ?Take one tab po QD ? ? ?Return in about 6 months (around 02/18/2022) for TBSE, Acne with Dr Laurence Ferrari. ? ?I, Jamesetta Orleans, CMA, am acting as scribe for Brendolyn Patty, MD . ? ?Documentation: I have reviewed the above documentation for accuracy and completeness, and I agree with the above. ? ?Brendolyn Patty MD  ? ? ?

## 2022-02-23 ENCOUNTER — Encounter: Payer: BC Managed Care – PPO | Admitting: Dermatology

## 2022-03-23 ENCOUNTER — Encounter: Payer: BC Managed Care – PPO | Admitting: Dermatology

## 2022-04-26 ENCOUNTER — Encounter: Payer: BC Managed Care – PPO | Admitting: Dermatology

## 2022-04-26 ENCOUNTER — Other Ambulatory Visit: Payer: Self-pay

## 2022-04-26 ENCOUNTER — Telehealth: Payer: Self-pay

## 2022-04-26 MED ORDER — CLINDAMYCIN PHOSPHATE 1 % EX SOLN: CUTANEOUS | 2 refills | Status: AC

## 2022-04-26 NOTE — Telephone Encounter (Signed)
Please send in clindamycin solution to use once to twice a day and pair with OTC benzoyl peroxide gel or benzoyl peroxide wash (caution bleaching, dryness). Thank you!

## 2022-04-26 NOTE — Telephone Encounter (Signed)
Sure. Please send. Thank you!

## 2022-04-26 NOTE — Telephone Encounter (Signed)
Patient called regarding new insurance change. She currently has [facility] which we are out of network with. She will have to find a new provider.   Patient's Benzaclin is no longer covered and she wanted to know could we sent in alternative for her to have while she is in the search for a new dermatologist?

## 2022-04-26 NOTE — Telephone Encounter (Signed)
You could also advise her to check on coverage for [facility] in [place] if she is traveling farther anyway. They have two excellent dermatologists and recently opened, so may have a shorter wait list than [facility].

## 2022-04-27 ENCOUNTER — Other Ambulatory Visit: Payer: Self-pay

## 2022-04-27 DIAGNOSIS — L700 Acne vulgaris: Secondary | ICD-10-CM

## 2022-05-02 NOTE — Telephone Encounter (Signed)
Patient advised of [facility] aw

## 2022-08-03 ENCOUNTER — Encounter: Payer: BC Managed Care – PPO | Admitting: Dermatology

## 2023-01-31 IMAGING — CR DG THORACIC SPINE 2V
3 series · 3 of 3 positions shown · non-contrast
Comparison: None.

CLINICAL DATA: Mid back pain

EXAM:
THORACIC SPINE 2 VIEWS

[t-spine ap]
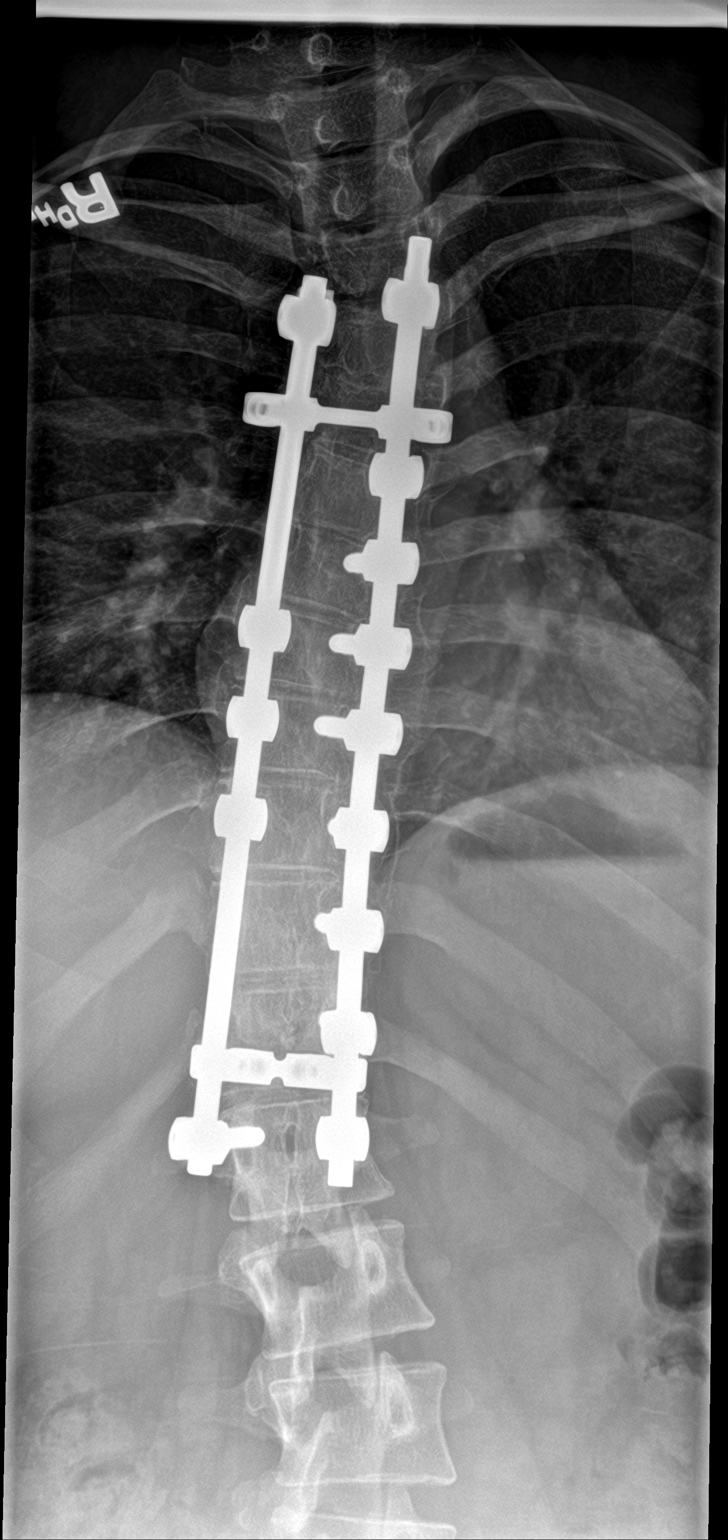

[t-spine lat]
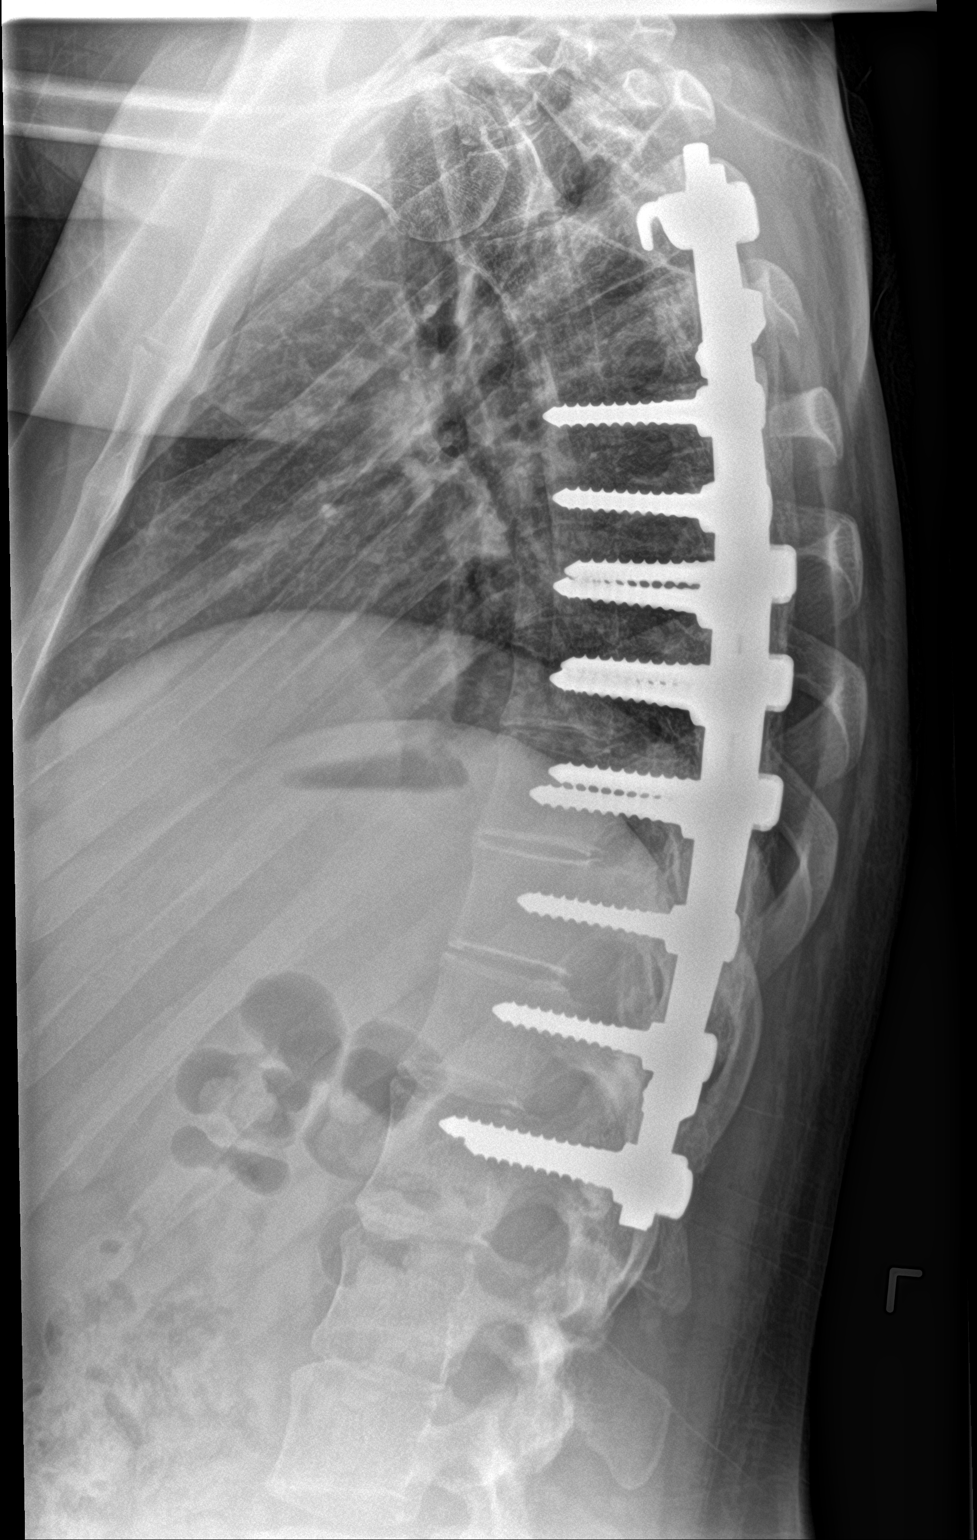

[t-spine swimmers]
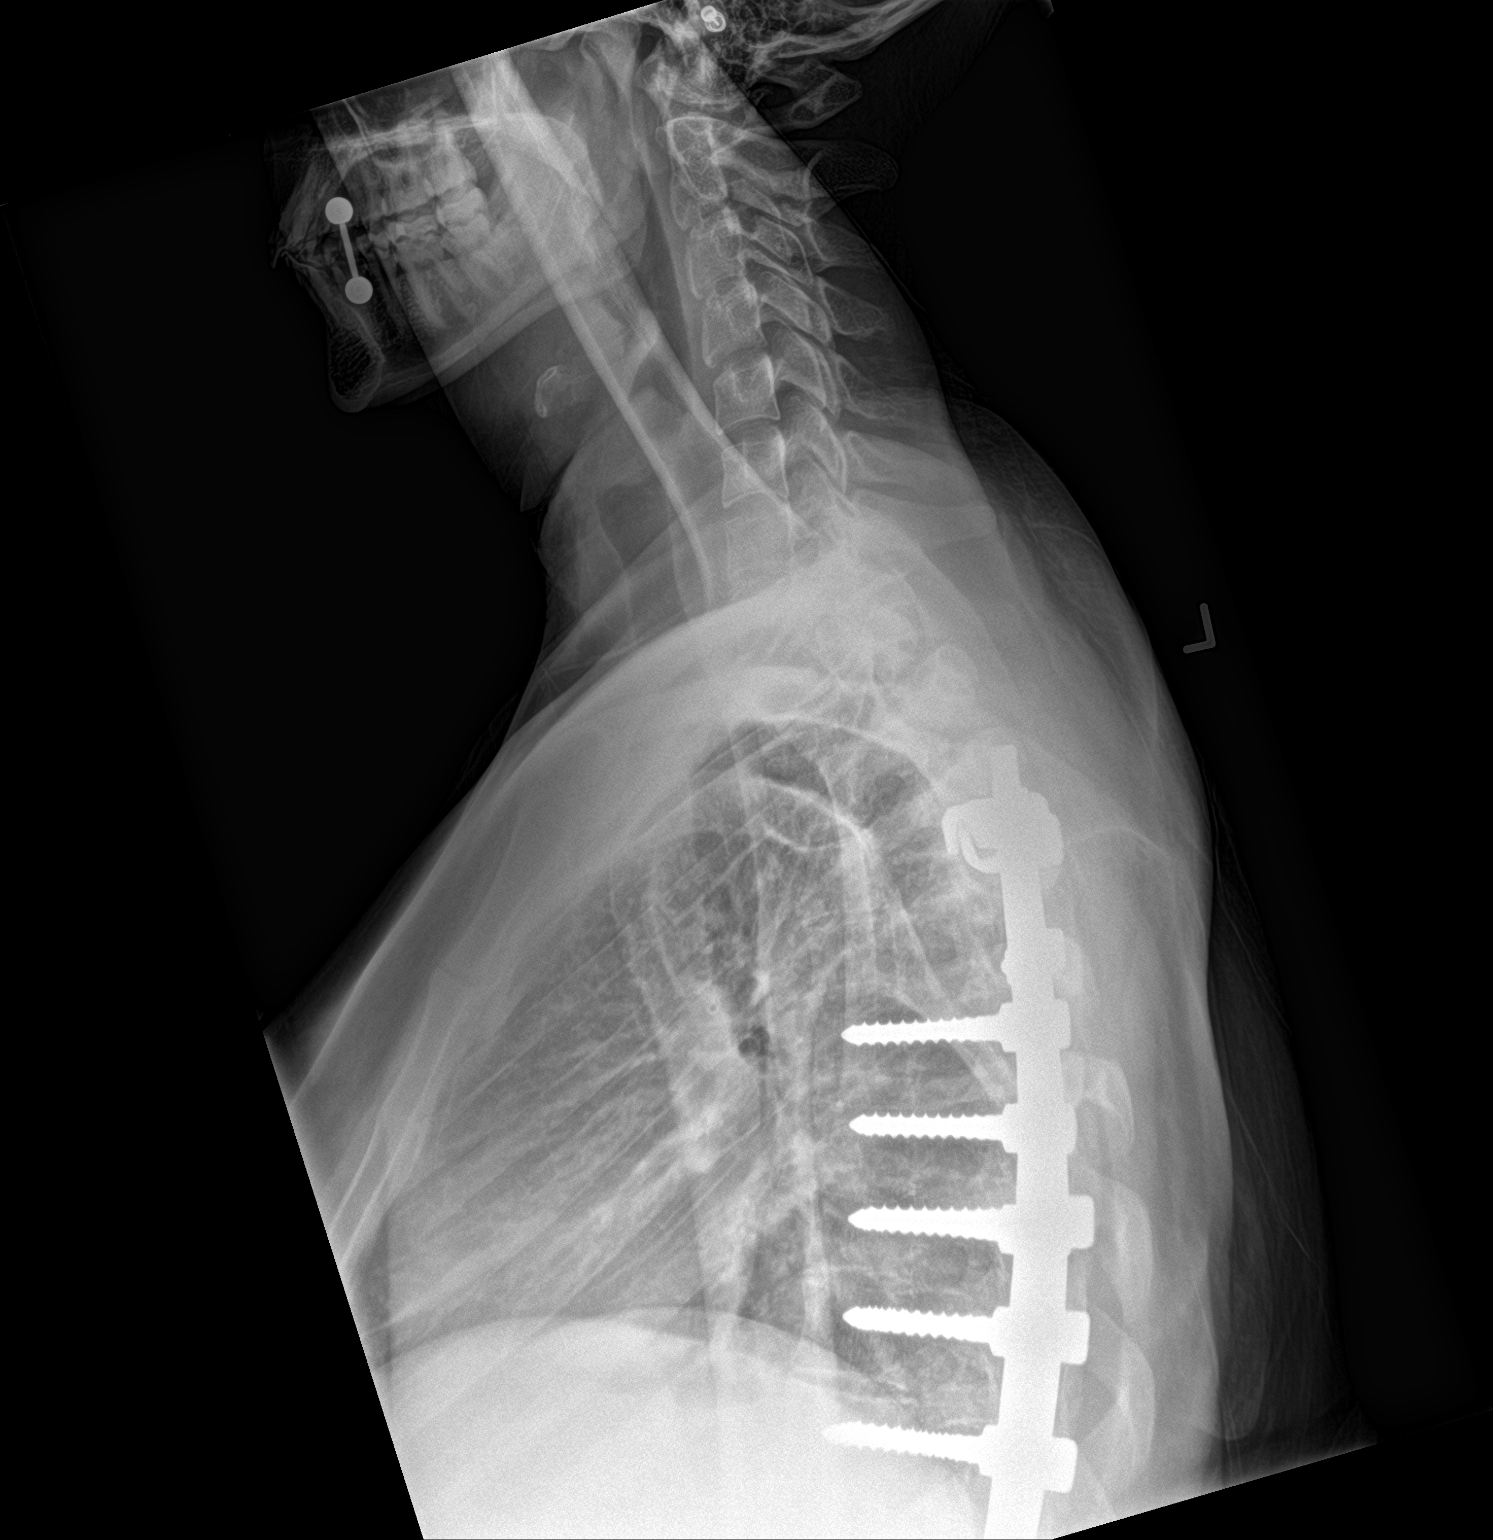

[3 of 3 positions shown; findings below may reference images not displayed]

FINDINGS: Scoliosis of the thoracolumbar spine. Posterior spinal rods and
fixating screws from T4 through L1. Spinal hardware appears intact.
Vertebral body heights are maintained.
IMPRESSION: Scoliosis of the thoracolumbar spine with posterior fusion from T4
through L1. No acute osseous abnormality.
